# Patient Record
Sex: Female | Born: 2000 | Race: Black or African American | Hispanic: No | Marital: Single | Smoking: Former smoker
Health system: Southern US, Community
[De-identification: ages and names within clinical notes are randomized; demographics above are authoritative.]

## PROBLEM LIST (undated history)

## (undated) DIAGNOSIS — O24419 Gestational diabetes mellitus in pregnancy, unspecified control: Secondary | ICD-10-CM

## (undated) DIAGNOSIS — I10 Essential (primary) hypertension: Secondary | ICD-10-CM

## (undated) DIAGNOSIS — J45909 Unspecified asthma, uncomplicated: Secondary | ICD-10-CM

## (undated) HISTORY — PX: ABDOMINAL HERNIA REPAIR: SHX539

## (undated) HISTORY — DX: Essential (primary) hypertension: I10

## (undated) HISTORY — DX: Gestational diabetes mellitus in pregnancy, unspecified control: O24.419

---

## 2020-05-10 ENCOUNTER — Emergency Department (HOSPITAL_COMMUNITY): Payer: PRIVATE HEALTH INSURANCE | Admitting: Registered Nurse

## 2020-05-10 ENCOUNTER — Encounter (HOSPITAL_COMMUNITY): Payer: Self-pay | Admitting: *Deleted

## 2020-05-10 ENCOUNTER — Emergency Department (HOSPITAL_COMMUNITY): Payer: PRIVATE HEALTH INSURANCE

## 2020-05-10 ENCOUNTER — Encounter (HOSPITAL_COMMUNITY)
Admission: EM | Disposition: A | Discharge status: Home / Self Care | Payer: Self-pay | Source: Home / Self Care | Attending: Emergency Medicine

## 2020-05-10 ENCOUNTER — Ambulatory Visit (HOSPITAL_COMMUNITY)
Admission: EM | Admit: 2020-05-10 | Discharge: 2020-05-10 | Disposition: A | Discharge status: Home / Self Care | Payer: PRIVATE HEALTH INSURANCE | Attending: Emergency Medicine | Admitting: Emergency Medicine

## 2020-05-10 ENCOUNTER — Other Ambulatory Visit: Payer: Self-pay | Admitting: Obstetrics and Gynecology

## 2020-05-10 ENCOUNTER — Other Ambulatory Visit: Payer: Self-pay

## 2020-05-10 DIAGNOSIS — N9489 Other specified conditions associated with female genital organs and menstrual cycle: Secondary | ICD-10-CM | POA: Diagnosis not present

## 2020-05-10 DIAGNOSIS — Z20822 Contact with and (suspected) exposure to covid-19: Secondary | ICD-10-CM | POA: Insufficient documentation

## 2020-05-10 DIAGNOSIS — R52 Pain, unspecified: Secondary | ICD-10-CM

## 2020-05-10 DIAGNOSIS — Z9889 Other specified postprocedural states: Secondary | ICD-10-CM

## 2020-05-10 DIAGNOSIS — O009 Unspecified ectopic pregnancy without intrauterine pregnancy: Secondary | ICD-10-CM | POA: Diagnosis present

## 2020-05-10 DIAGNOSIS — K661 Hemoperitoneum: Secondary | ICD-10-CM

## 2020-05-10 DIAGNOSIS — Z3A00 Weeks of gestation of pregnancy not specified: Secondary | ICD-10-CM | POA: Diagnosis not present

## 2020-05-10 DIAGNOSIS — O00102 Left tubal pregnancy without intrauterine pregnancy: Secondary | ICD-10-CM

## 2020-05-10 DIAGNOSIS — O3680X0 Pregnancy with inconclusive fetal viability, not applicable or unspecified: Secondary | ICD-10-CM

## 2020-05-10 HISTORY — DX: Unspecified asthma, uncomplicated: J45.909

## 2020-05-10 HISTORY — PX: LAPAROSCOPIC UNILATERAL SALPINGECTOMY: SHX5934

## 2020-05-10 HISTORY — PX: DIAGNOSTIC LAPAROSCOPY WITH REMOVAL OF ECTOPIC PREGNANCY: SHX6449

## 2020-05-10 LAB — COMPREHENSIVE METABOLIC PANEL
ALT: 16 U/L (ref 0–44)
AST: 18 U/L (ref 15–41)
Albumin: 3.9 g/dL (ref 3.5–5.0)
Alkaline Phosphatase: 41 U/L (ref 38–126)
Anion gap: 8 (ref 5–15)
BUN: 11 mg/dL (ref 6–20)
CO2: 21 mmol/L — ABNORMAL LOW (ref 22–32)
Calcium: 8.6 mg/dL — ABNORMAL LOW (ref 8.9–10.3)
Chloride: 110 mmol/L (ref 98–111)
Creatinine, Ser: 0.84 mg/dL (ref 0.44–1.00)
GFR, Estimated: >60 mL/min (ref >60)
Glucose, Bld: 162 mg/dL — ABNORMAL HIGH (ref 70–99)
Potassium: 2.7 mmol/L — CL (ref 3.5–5.1)
Sodium: 139 mmol/L (ref 135–145)
Total Bilirubin: 0.1 mg/dL — ABNORMAL LOW (ref 0.3–1.2)
Total Protein: 6.7 g/dL (ref 6.5–8.1)

## 2020-05-10 LAB — CBC WITH DIFFERENTIAL/PLATELET
Abs Immature Granulocytes: 0.02 10*3/uL (ref 0.00–0.07)
Basophils Absolute: 0 10*3/uL (ref 0.0–0.1)
Basophils Relative: 1 %
Eosinophils Absolute: 0.3 10*3/uL (ref 0.0–0.5)
Eosinophils Relative: 4 %
HCT: 38.9 % (ref 36.0–46.0)
Hemoglobin: 12.5 g/dL (ref 12.0–15.0)
Immature Granulocytes: 0 %
Lymphocytes Relative: 54 %
Lymphs Abs: 3.6 10*3/uL (ref 0.7–4.0)
MCH: 28.9 pg (ref 26.0–34.0)
MCHC: 32.1 g/dL (ref 30.0–36.0)
MCV: 90 fL (ref 80.0–100.0)
Monocytes Absolute: 0.6 10*3/uL (ref 0.1–1.0)
Monocytes Relative: 9 %
Neutro Abs: 2.1 10*3/uL (ref 1.7–7.7)
Neutrophils Relative %: 32 %
Platelets: 313 10*3/uL (ref 150–400)
RBC: 4.32 MIL/uL (ref 3.87–5.11)
RDW: 13.2 % (ref 11.5–15.5)
WBC: 6.5 10*3/uL (ref 4.0–10.5)
nRBC: 0 % (ref 0.0–0.2)

## 2020-05-10 LAB — CBC
HCT: 33.9 % — ABNORMAL LOW (ref 36.0–46.0)
Hemoglobin: 11.4 g/dL — ABNORMAL LOW (ref 12.0–15.0)
MCH: 30.1 pg (ref 26.0–34.0)
MCHC: 33.6 g/dL (ref 30.0–36.0)
MCV: 89.4 fL (ref 80.0–100.0)
Platelets: 175 10*3/uL (ref 150–400)
RBC: 3.79 MIL/uL — ABNORMAL LOW (ref 3.87–5.11)
RDW: 13.2 % (ref 11.5–15.5)
WBC: 20.4 10*3/uL — ABNORMAL HIGH (ref 4.0–10.5)
nRBC: 0 % (ref 0.0–0.2)

## 2020-05-10 LAB — TYPE AND SCREEN
ABO/RH(D): A POS
Antibody Screen: NEGATIVE

## 2020-05-10 LAB — RESP PANEL BY RT-PCR (FLU A&B, COVID) ARPGX2
Influenza A by PCR: NEGATIVE
Influenza B by PCR: NEGATIVE
SARS Coronavirus 2 by RT PCR: NEGATIVE

## 2020-05-10 LAB — I-STAT BETA HCG BLOOD, ED (MC, WL, AP ONLY): I-stat hCG, quantitative: >2000 m[IU]/mL — ABNORMAL HIGH (ref <5)

## 2020-05-10 LAB — HCG, QUANTITATIVE, PREGNANCY: hCG, Beta Chain, Quant, S: 3495 m[IU]/mL — ABNORMAL HIGH (ref <5)

## 2020-05-10 LAB — WET PREP, GENITAL
Sperm: NONE SEEN
Trich, Wet Prep: NONE SEEN
Yeast Wet Prep HPF POC: NONE SEEN

## 2020-05-10 LAB — ABO/RH: ABO/RH(D): A POS

## 2020-05-10 SURGERY — LAPAROSCOPY, WITH ECTOPIC PREGNANCY SURGICAL TREATMENT
Anesthesia: General

## 2020-05-10 MED ORDER — PROPOFOL 10 MG/ML IV BOLUS
INTRAVENOUS | Status: AC
  Filled 2020-05-10: qty 20

## 2020-05-10 MED ORDER — MIDAZOLAM HCL 2 MG/2ML IJ SOLN
INTRAMUSCULAR | Status: DC | PRN
  Administered 2020-05-10: 2 mg via INTRAVENOUS

## 2020-05-10 MED ORDER — BUPIVACAINE HCL 0.5 % IJ SOLN
INTRAMUSCULAR | Status: DC | PRN
  Administered 2020-05-10: 20 mL

## 2020-05-10 MED ORDER — FENTANYL CITRATE (PF) 250 MCG/5ML IJ SOLN
INTRAMUSCULAR | Status: DC | PRN
  Administered 2020-05-10: 50 ug via INTRAVENOUS
  Administered 2020-05-10: 100 ug via INTRAVENOUS
  Administered 2020-05-10 (×2): 50 ug via INTRAVENOUS

## 2020-05-10 MED ORDER — SODIUM CHLORIDE 0.9% IV SOLUTION: Freq: Once | INTRAVENOUS | Status: DC

## 2020-05-10 MED ORDER — POTASSIUM CHLORIDE 10 MEQ/100ML IV SOLN
10.0000 meq | Freq: Once | INTRAVENOUS | Status: AC
  Administered 2020-05-10: 10 meq via INTRAVENOUS
  Filled 2020-05-10: qty 100

## 2020-05-10 MED ORDER — ONDANSETRON HCL 4 MG/2ML IJ SOLN
4.0000 mg | Freq: Once | INTRAMUSCULAR | Status: AC
  Administered 2020-05-10: 4 mg via INTRAVENOUS
  Filled 2020-05-10: qty 2

## 2020-05-10 MED ORDER — MORPHINE SULFATE (PF) 4 MG/ML IV SOLN
4.0000 mg | Freq: Once | INTRAVENOUS | Status: AC
  Administered 2020-05-10: 4 mg via INTRAVENOUS
  Filled 2020-05-10: qty 1

## 2020-05-10 MED ORDER — ROCURONIUM BROMIDE 10 MG/ML (PF) SYRINGE
PREFILLED_SYRINGE | INTRAVENOUS | Status: DC | PRN
  Administered 2020-05-10: 10 mg via INTRAVENOUS
  Administered 2020-05-10: 50 mg via INTRAVENOUS

## 2020-05-10 MED ORDER — DEXAMETHASONE SODIUM PHOSPHATE 10 MG/ML IJ SOLN
INTRAMUSCULAR | Status: AC
  Filled 2020-05-10: qty 1

## 2020-05-10 MED ORDER — SODIUM CHLORIDE 0.9 % IV SOLN: INTRAVENOUS | Status: DC

## 2020-05-10 MED ORDER — AMISULPRIDE (ANTIEMETIC) 5 MG/2ML IV SOLN
INTRAVENOUS | Status: AC
  Filled 2020-05-10: qty 4

## 2020-05-10 MED ORDER — PHENYLEPHRINE 40 MCG/ML (10ML) SYRINGE FOR IV PUSH (FOR BLOOD PRESSURE SUPPORT)
PREFILLED_SYRINGE | INTRAVENOUS | Status: DC | PRN
  Administered 2020-05-10 (×2): 80 ug via INTRAVENOUS
  Administered 2020-05-10: 120 ug via INTRAVENOUS

## 2020-05-10 MED ORDER — ACETAMINOPHEN 10 MG/ML IV SOLN: 1000.0000 mg | Freq: Once | INTRAVENOUS | Status: DC | PRN

## 2020-05-10 MED ORDER — OXYCODONE HCL 5 MG PO TABS: 5.0000 mg | ORAL_TABLET | Freq: Once | ORAL | Status: DC | PRN

## 2020-05-10 MED ORDER — PHENYLEPHRINE 40 MCG/ML (10ML) SYRINGE FOR IV PUSH (FOR BLOOD PRESSURE SUPPORT)
PREFILLED_SYRINGE | INTRAVENOUS | Status: AC
  Filled 2020-05-10: qty 10

## 2020-05-10 MED ORDER — HYDROMORPHONE HCL 1 MG/ML IJ SOLN
INTRAMUSCULAR | Status: AC
  Filled 2020-05-10: qty 0.5

## 2020-05-10 MED ORDER — BUPIVACAINE HCL (PF) 0.5 % IJ SOLN
INTRAMUSCULAR | Status: AC
  Filled 2020-05-10: qty 30

## 2020-05-10 MED ORDER — ACETAMINOPHEN 325 MG PO TABS
325.0000 mg | ORAL_TABLET | ORAL | Status: DC | PRN
Start: 2020-05-10 — End: 2020-05-10

## 2020-05-10 MED ORDER — FENTANYL CITRATE (PF) 250 MCG/5ML IJ SOLN
INTRAMUSCULAR | Status: AC
  Filled 2020-05-10: qty 5

## 2020-05-10 MED ORDER — LIDOCAINE 2% (20 MG/ML) 5 ML SYRINGE
INTRAMUSCULAR | Status: DC | PRN
  Administered 2020-05-10: 40 mg via INTRAVENOUS

## 2020-05-10 MED ORDER — SUCCINYLCHOLINE CHLORIDE 200 MG/10ML IV SOSY
PREFILLED_SYRINGE | INTRAVENOUS | Status: DC | PRN
  Administered 2020-05-10: 120 mg via INTRAVENOUS

## 2020-05-10 MED ORDER — CHLORHEXIDINE GLUCONATE 0.12 % MT SOLN: 15.0000 mL | Freq: Once | OROMUCOSAL | Status: AC

## 2020-05-10 MED ORDER — PROPOFOL 10 MG/ML IV BOLUS
INTRAVENOUS | Status: DC | PRN
  Administered 2020-05-10: 140 mg via INTRAVENOUS

## 2020-05-10 MED ORDER — OXYCODONE-ACETAMINOPHEN 5-325 MG PO TABS: 1.0000 | ORAL_TABLET | Freq: Four times a day (QID) | ORAL | 0 refills | Status: DC | PRN

## 2020-05-10 MED ORDER — AMISULPRIDE (ANTIEMETIC) 5 MG/2ML IV SOLN
10.0000 mg | Freq: Once | INTRAVENOUS | Status: AC | PRN
Start: 2020-05-10 — End: 2020-05-10
  Administered 2020-05-10: 10 mg via INTRAVENOUS

## 2020-05-10 MED ORDER — SODIUM CHLORIDE 0.9 % IV SOLN: 10.0000 mL/h | Freq: Once | INTRAVENOUS | Status: DC

## 2020-05-10 MED ORDER — LACTATED RINGERS IV SOLN: INTRAVENOUS | Status: DC

## 2020-05-10 MED ORDER — FERROUS SULFATE 325 (65 FE) MG PO TABS: 325.0000 mg | ORAL_TABLET | ORAL | 1 refills | Status: DC

## 2020-05-10 MED ORDER — SODIUM CHLORIDE 0.9 % IR SOLN
Status: DC | PRN
  Administered 2020-05-10: 3000 mL

## 2020-05-10 MED ORDER — FENTANYL CITRATE (PF) 100 MCG/2ML IJ SOLN: 25.0000 ug | INTRAMUSCULAR | Status: DC | PRN

## 2020-05-10 MED ORDER — LIDOCAINE 2% (20 MG/ML) 5 ML SYRINGE
INTRAMUSCULAR | Status: AC
  Filled 2020-05-10: qty 5

## 2020-05-10 MED ORDER — DOCUSATE SODIUM 100 MG PO CAPS: 100.0000 mg | ORAL_CAPSULE | Freq: Two times a day (BID) | ORAL | 1 refills | Status: AC | PRN

## 2020-05-10 MED ORDER — ACETAMINOPHEN 160 MG/5ML PO SOLN: 325.0000 mg | ORAL | Status: DC | PRN

## 2020-05-10 MED ORDER — SUGAMMADEX SODIUM 200 MG/2ML IV SOLN
INTRAVENOUS | Status: DC | PRN
  Administered 2020-05-10: 400 mg via INTRAVENOUS

## 2020-05-10 MED ORDER — MORPHINE SULFATE (PF) 2 MG/ML IV SOLN
2.0000 mg | Freq: Once | INTRAVENOUS | Status: AC
Start: 2020-05-10 — End: 2020-05-10
  Administered 2020-05-10: 2 mg via INTRAVENOUS
  Filled 2020-05-10: qty 1

## 2020-05-10 MED ORDER — SUCCINYLCHOLINE CHLORIDE 200 MG/10ML IV SOSY
PREFILLED_SYRINGE | INTRAVENOUS | Status: AC
  Filled 2020-05-10: qty 10

## 2020-05-10 MED ORDER — ONDANSETRON HCL 4 MG/2ML IJ SOLN
INTRAMUSCULAR | Status: DC | PRN
  Administered 2020-05-10: 4 mg via INTRAVENOUS

## 2020-05-10 MED ORDER — CHLORHEXIDINE GLUCONATE 0.12 % MT SOLN
OROMUCOSAL | Status: AC
  Administered 2020-05-10: 15 mL via OROMUCOSAL
  Filled 2020-05-10: qty 15

## 2020-05-10 MED ORDER — ONDANSETRON HCL 4 MG/2ML IJ SOLN
INTRAMUSCULAR | Status: AC
  Filled 2020-05-10: qty 2

## 2020-05-10 MED ORDER — MIDAZOLAM HCL 2 MG/2ML IJ SOLN
INTRAMUSCULAR | Status: AC
  Filled 2020-05-10: qty 2

## 2020-05-10 MED ORDER — DEXAMETHASONE SODIUM PHOSPHATE 10 MG/ML IJ SOLN
INTRAMUSCULAR | Status: DC | PRN
  Administered 2020-05-10: 10 mg via INTRAVENOUS

## 2020-05-10 MED ORDER — HYDROMORPHONE HCL 1 MG/ML IJ SOLN
INTRAMUSCULAR | Status: DC | PRN
  Administered 2020-05-10: .5 mg via INTRAVENOUS

## 2020-05-10 MED ORDER — ALBUMIN HUMAN 5 % IV SOLN: INTRAVENOUS | Status: DC | PRN

## 2020-05-10 MED ORDER — OXYCODONE HCL 5 MG/5ML PO SOLN: 5.0000 mg | Freq: Once | ORAL | Status: DC | PRN

## 2020-05-10 MED ORDER — ORAL CARE MOUTH RINSE
15.0000 mL | Freq: Once | OROMUCOSAL | Status: AC
Start: 2020-05-10 — End: 2020-05-10

## 2020-05-10 MED ORDER — SCOPOLAMINE 1 MG/3DAYS TD PT72: 1.0000 | MEDICATED_PATCH | TRANSDERMAL | Status: DC

## 2020-05-10 MED ORDER — PROMETHAZINE HCL 25 MG/ML IJ SOLN: 6.2500 mg | INTRAMUSCULAR | Status: DC | PRN

## 2020-05-10 MED ORDER — ROCURONIUM BROMIDE 10 MG/ML (PF) SYRINGE
PREFILLED_SYRINGE | INTRAVENOUS | Status: AC
  Filled 2020-05-10: qty 10

## 2020-05-10 MED ORDER — SODIUM CHLORIDE 0.9 % IV BOLUS
1000.0000 mL | Freq: Once | INTRAVENOUS | Status: AC
  Administered 2020-05-10: 1000 mL via INTRAVENOUS

## 2020-05-10 SURGICAL SUPPLY — 42 items
APPLICATOR COTTON TIP 6 STRL (MISCELLANEOUS) ×2 IMPLANT
APPLICATOR COTTON TIP 6IN STRL (MISCELLANEOUS) ×3
BLADE SURG 15 STRL LF DISP TIS (BLADE) ×2 IMPLANT
BLADE SURG 15 STRL SS (BLADE) ×1
CABLE HIGH FREQUENCY MONO STRZ (ELECTRODE) IMPLANT
DEFOGGER SCOPE WARMER CLEARIFY (MISCELLANEOUS) ×3 IMPLANT
DERMABOND ADVANCED (GAUZE/BANDAGES/DRESSINGS) ×1
DERMABOND ADVANCED .7 DNX12 (GAUZE/BANDAGES/DRESSINGS) ×2 IMPLANT
DRSG OPSITE POSTOP 3X4 (GAUZE/BANDAGES/DRESSINGS) ×3 IMPLANT
DURAPREP 26ML APPLICATOR (WOUND CARE) ×3 IMPLANT
ELECT REM PT RETURN 9FT ADLT (ELECTROSURGICAL) ×3
ELECTRODE REM PT RTRN 9FT ADLT (ELECTROSURGICAL) ×2 IMPLANT
GLOVE BIOGEL PI IND STRL 7.5 (GLOVE) ×4 IMPLANT
GLOVE BIOGEL PI INDICATOR 7.5 (GLOVE) ×2
GLOVE SURG SS PI 7.0 STRL IVOR (GLOVE) ×3 IMPLANT
GLOVE SURG UNDER POLY LF SZ7 (GLOVE) ×6 IMPLANT
GOWN STRL REUS W/ TWL LRG LVL3 (GOWN DISPOSABLE) ×6 IMPLANT
GOWN STRL REUS W/TWL LRG LVL3 (GOWN DISPOSABLE) ×3
KIT TURNOVER KIT B (KITS) ×3 IMPLANT
LIGASURE VESSEL 5MM BLUNT TIP (ELECTROSURGICAL) ×3 IMPLANT
NS IRRIG 1000ML POUR BTL (IV SOLUTION) ×3 IMPLANT
PACK LAPAROSCOPY BASIN (CUSTOM PROCEDURE TRAY) ×3 IMPLANT
PACK TRENDGUARD 450 HYBRID PRO (MISCELLANEOUS) ×2 IMPLANT
PAD OB MATERNITY 4.3X12.25 (PERSONAL CARE ITEMS) ×3 IMPLANT
POUCH LAPAROSCOPIC INSTRUMENT (MISCELLANEOUS) ×3 IMPLANT
POUCH SPECIMEN RETRIEVAL 10MM (ENDOMECHANICALS) ×3 IMPLANT
PROTECTOR NERVE ULNAR (MISCELLANEOUS) ×6 IMPLANT
SCISSORS LAP 5X35 DISP (ENDOMECHANICALS) IMPLANT
SET IRRIG TUBING LAPAROSCOPIC (IRRIGATION / IRRIGATOR) ×3 IMPLANT
SET TUBE SMOKE EVAC HIGH FLOW (TUBING) ×3 IMPLANT
SLEEVE ADV FIXATION 5X100MM (TROCAR) ×3 IMPLANT
SUT MON AB 4-0 PS1 27 (SUTURE) ×3 IMPLANT
SUT VICRYL 0 UR6 27IN ABS (SUTURE) ×3 IMPLANT
SYR 10ML LL (SYRINGE) ×3 IMPLANT
SYSTEM CARTER THOMASON II (TROCAR) ×3 IMPLANT
TOWEL GREEN STERILE FF (TOWEL DISPOSABLE) ×6 IMPLANT
TRAY FOLEY W/BAG SLVR 14FR (SET/KITS/TRAYS/PACK) ×3 IMPLANT
TRENDGUARD 450 HYBRID PRO PACK (MISCELLANEOUS) ×3
TROCAR ADV FIXATION 5X100MM (TROCAR) ×3 IMPLANT
TROCAR BALLN 12MMX100 BLUNT (TROCAR) ×3 IMPLANT
TROCAR XCEL NON-BLD 11X100MML (ENDOMECHANICALS) ×3 IMPLANT
WARMER LAPAROSCOPE (MISCELLANEOUS) ×3 IMPLANT

## 2020-05-10 NOTE — ED Triage Notes (Signed)
Pt came in with c/o lower abdominal pain. Pt states it is in her suprapubic region. Pt has hx of hernia and hernia repair when she was [age] years old. Pt states it started after a BM

## 2020-05-10 NOTE — ED Notes (Signed)
This nurse spoke with Rolene Course, RN at [facility] Stay and report given. Dr. Vergie Living is the accepting physician, per Rolene Course, RN.

## 2020-05-10 NOTE — ED Notes (Addendum)
Pt with stable VS ambulated to the bathroom independently. This nurse heard the pt call out from the bathroom. This nurse entered the bathroom and the pt was standing upright and stated she felt like she was going to pass out. This nurse sat the pt back onto the bathroom toilet and with the assistance of a second ED RN and ED tech, pt was assisted into a wheelchair and taken back to there ED exam room. Pt was able to move from wheelchair to stretcher with stand-by assist. Pt's VS obtained and stable. 2nd liter NS bolus hung. EKG obtained and provided to Dr. Freida Busman. Dr. Freida Busman notified of above information. No additional orders at this time. Will continue to monitor. US exam was completed following pt's return from the bathroom. Pt unable to provide urine sample at this time.

## 2020-05-10 NOTE — Transfer of Care (Signed)
Immediate Anesthesia Transfer of Care Note  Patient: Alison Morales  Procedure(s) Performed: DIAGNOSTIC LAPAROSCOPY (N/A ) LAPAROSCOPIC LEFT SALPINGECTOMY WITH REMOVAL OF ECTOPIC PREGNANCY (Left )  Patient Location: PACU  Anesthesia Type:General  Level of Consciousness: awake, alert  and oriented  Airway & Oxygen Therapy: Patient Spontanous Breathing  Post-op Assessment: Report given to RN and Post -op Vital signs reviewed and stable  Post vital signs: Reviewed and stable  Last Vitals:  Vitals Value Taken Time  BP 152/77 05/10/20 1312  Temp 36.4 C 05/10/20 1312  Pulse 97 05/10/20 1317  Resp 18 05/10/20 1317  SpO2 99 % 05/10/20 1317  Vitals shown include unvalidated device data.  Last Pain:  Vitals:   05/10/20 1312  TempSrc:   PainSc: 0-No pain         Complications: No complications documented.

## 2020-05-10 NOTE — Anesthesia Procedure Notes (Signed)

## 2020-05-10 NOTE — Brief Op Note (Signed)
05/10/2020  1:24 PM  PATIENT:  Alison Morales  20 y.o. female  PRE-OPERATIVE DIAGNOSIS:  Ruptured Ectopic Pregnancy  POST-OPERATIVE DIAGNOSIS:  Ruptured Ectopic Pregnancy  PROCEDURE:  Laparoscopic left salpingectomy SURGEON:  Surgeon(s) and Role:    Marks Bing, MD - Primary  PHYSICIAN ASSISTANT:   ASSISTANTS: none   ANESTHESIA:   general  EBL:  1500 mL of hemoperitoneum; minimal EBL for the case  BLOOD ADMINISTERED:2U PRBC to be given in PACU  IVF: albumin and crystalloid in the OR  DRAINS: indwelling foley UOP clear   LOCAL MEDICATIONS USED:  MARCAINE     SPECIMEN:  Left tube with ectopic  DISPOSITION OF SPECIMEN:  PATHOLOGY  COUNTS:  YES  TOURNIQUET:  * No tourniquets in log *  DICTATION: .Note written in EPIC  PLAN OF CARE: Discharge to home after PACU. Plan to recheck CBC in PACU after PRBCs and if appropriate to d/c to home.   PATIENT DISPOSITION:  PACU - hemodynamically stable.   Delay start of Pharmacological VTE agent (>24hrs) due to surgical blood loss or risk of bleeding: not applicable  Cornelia Copa MD Attending Center for [facility] (Faculty Practice) GYN Consult Phone: [phone] (M-F, 0800-1700) & [phone] (Off hours, weekends, holidays)

## 2020-05-10 NOTE — Anesthesia Preprocedure Evaluation (Signed)
Anesthesia Evaluation  Patient identified by MRN, date of birth, ID band Patient awake    Reviewed: Allergy & Precautions, NPO status , Patient's Chart, lab work & pertinent test results  Airway Mallampati: I  TM Distance: >3 FB Neck ROM: Full    Dental  (+) Teeth Intact, Dental Advisory Given   Pulmonary neg pulmonary ROS,    breath sounds clear to auscultation       Cardiovascular negative cardio ROS   Rhythm:Regular Rate:Normal     Neuro/Psych negative neurological ROS     GI/Hepatic Neg liver ROS,   Endo/Other    Renal/GU      Musculoskeletal   Abdominal Normal abdominal exam  (+)   Peds  Hematology negative hematology ROS (+)   Anesthesia Other Findings   Reproductive/Obstetrics                             Anesthesia Physical Anesthesia Plan  ASA: I and emergent  Anesthesia Plan: General   Post-op Pain Management:    Induction: Rapid sequence and Cricoid pressure planned  PONV Risk Score and Plan: 4 or greater and Ondansetron, Dexamethasone, Midazolam and Scopolamine patch - Pre-op  Airway Management Planned: Oral ETT  Additional Equipment: None  Intra-op Plan:   Post-operative Plan: Extubation in OR  Informed Consent: I have reviewed the patients History and Physical, chart, labs and discussed the procedure including the risks, benefits and alternatives for the proposed anesthesia with the patient or authorized representative who has indicated his/her understanding and acceptance.     Dental advisory given  Plan Discussed with: CRNA  Anesthesia Plan Comments:         Anesthesia Quick Evaluation

## 2020-05-10 NOTE — ED Notes (Signed)
Told patient we need a urine sample. Patient unable to go at this moment.

## 2020-05-10 NOTE — Progress Notes (Signed)
Patient lost 1500 mL of blood in the OR. MD ordered two units of blood to be given in PACU. [facility] CRNA started the first transfusion and second transfusion was given by PACU RN. Patient remained stable during both administrations. A post transfusion CBC was done and we are waiting for the results. [place] MD and Hart Rochester MD will both be notified of the CBC results. Patient has been placed on PACU hold. Will continue to monitor the patient.

## 2020-05-10 NOTE — ED Notes (Signed)
Report received from Felipa Furnace, RN.

## 2020-05-10 NOTE — Op Note (Signed)
Operative Note   05/10/2020  PRE-OP DIAGNOSIS *Left ectopic pregnancy   POST-OP DIAGNOSIS *Ruptured left ectopic pregnancy *Hemoperitoneum   SURGEON: Surgeon(s) and Role:    * Mangham Bing, MD - Primary  ASSISTANT: None  PROCEDURE: Laparoscopic left salpingectomy  ANESTHESIA: General and local  ESTIMATED BLOOD LOSS: of hemoperitoneum. EBL minimal for the case  DRAINS: indwelling foley UOP   TOTAL IV FLUIDS: of albumin. crystalloid  VTE PROPHYLAXIS: SCDs to the bilateral lower extremities  ANTIBIOTICS: not indicated  SPECIMENS: left fallopian tube  DISPOSITION: PACU - hemodynamically stable.  CONDITION: stable  COMPLICATIONS: None  FINDINGS: EGBUS, vaginal vault, cervix normal with small, mobile uterus palpated. Patient sounded to 8cm.   On diagnostic laparoscopy, large volume hemoperitnoeum. No intra-abdominal adhesions. Grossly normal uterus, right tube and bilateral ovaries.  Left tube, blue, engorged and slowly bleeding  DESCRIPTION OF PROCEDURE: After informed consent was obtained, the patient was taken to the operating room where anesthesia was obtained without difficulty. The patient was positioned in the dorsal lithotomy position in Liscomb stirrups and her arms were carefully tucked at her sides and the usual precautions were taken.  She was prepped and draped in normal sterile fashion.  Time-out was performed and a Foley catheter was placed into the bladder. A Hulka uterine manipulator was then placed in the uterus without incident. Gloves were then changed.  Anesthesia confirmed that there was a gastric tube in place and local anesthesia was then injected a [place].  A 13mm port was then placed with the laparoscope attached for visualization. Pressure was and abdomen then insufflated to . Laparoscope introduced and patient was then placed into Trendelenburg. A 93mm port was then placed in the LLQ and a 36mm port placed in  the suprapubic area under direct visualization and after injection of local anesthesia.  As much hemoperitoneum as possible was removed and the left tube removed by using the Ligasure device to serially cauterize and transect the mesosalpinx; a small stump was left at the cornua. The tube was then removed with the endocatch bag.  The patient was placed in reverse trendelenburg and then back into trendelenburg to try and remove as much hemoperitoneum as possible.  Gas was then dropped to and the operative site remained hemostatic.  Shellia Carwin II device used to close the suprapubic port with 0-vicryl. The LLQ port was removed under direct visualization and then the RUQ port was removed after the gas was released.  The fascia was then tied and the skin there and at the RUQ port closed with 4-0 monocryl and the LLQ skin closed with dermabond.  Hulka removed and cervix was hemostatic.   Sponge, lap and needle counts were correct x2.  The patient was taken to recovery room in excellent condition.  Cornelia Copa MD Attending Center for [employer] [facility])

## 2020-05-10 NOTE — H&P (Addendum)
Obstetrics & Gynecology H&P   Date of Admission: 05/10/2020   Requesting Provider: [facility]  Primary OBGYN: None Primary Care Provider: Pcp, No  Reason for Admission: concern for ectopic pregnancy, 10cm left adnexal mass  History of Present Illness: Alison Morales is a 20 y.o. G2P0010 (Patient's last menstrual period was 04/13/2020.), with the above CC. PMHx is significant for h/o hernia repair.  See ED notes but patient with abdominal pain starting today. ED called me and stated had istat hcg >2k and u/s concerning for ruptured ectopic with normal and stable VS. OR here and [facility] available so patient transferred here   Patient states with irregular periods (can be every 1-42m) and not on anything for birth control or to help regulate her periods. She did not know she was pregnant prior to presenting to the ED.   Last PO before midnight    ROS: A 12-point review of systems was performed and negative, except as stated in the above HPI.  OBGYN History: As per HPI.   Past Medical History: Past Medical History:  Diagnosis Date  . Asthma     Past Surgical History: As per HPI  Family History:  History reviewed. No pertinent family history.  Social History:  Social History   Socioeconomic History  . Marital status: Single    Spouse name: Not on file  . Number of children: Not on file  . Years of education: Not on file  . Highest education level: Not on file  Occupational History  . Not on file  Tobacco Use  . Smoking status: Never Smoker  . Smokeless tobacco: Never Used  Substance and Sexual Activity  . Alcohol use: Never  . Drug use: Never  . Sexual activity: Yes  Other Topics Concern  . Not on file  Social History Narrative  . Not on file   Social Determinants of Health   Financial Resource Strain: Not on file  Food Insecurity: Not on file  Transportation Needs: Not on file  Physical Activity: Not on file  Stress: Not on file  Social Connections: Not on  file  Intimate Partner Violence: Not on file    Allergy: No Known Allergies  Current Outpatient Medications: No medications prior to admission.     Hospital Medications: Current Facility-Administered Medications  Medication Dose Route Frequency Provider Last Rate Last Admin  . 0.9 %  sodium chloride infusion   Intravenous Continuous Edon Bing, MD      . potassium chloride 10 mEq in 100 mL IVPB  10 mEq Intravenous Once Shelton Silvas, MD      . scopolamine (TRANSDERM-SCOP) 1 MG/3DAYS 1.5 mg  1 patch Transdermal Q72H Shelton Silvas, MD         Physical Exam:   Current Vital Signs 24h Vital Sign Ranges  T 98.3 F (36.8 C) Temp  Avg: 98.5 F (36.9 C)  Min: 98.3 F (36.8 C)  Max: 98.6 F (37 C)  BP 111/73 BP  Min: 107/72  Max: 145/100  HR 86 Pulse  Avg: 67.9  Min: 60  Max: 86  RR 20 Resp  Avg: 18.9  Min: 15  Max: 22  SaO2 100 % Room Air SpO2  Avg: 99.9 %  Min: 98 %  Max: 100 %       24 Hour I/O Current Shift I/O  Time Ins Outs No intake/output data recorded. No intake/output data recorded.   Patient Vitals for the past 24 hrs:  BP Temp Temp src  Pulse Resp SpO2 Height Weight  05/10/20 0850 111/73 -- -- 86 20 100 % -- --  05/10/20 0840 122/77 -- -- 76 19 100 % -- --  05/10/20 0830 121/78 -- -- 71 20 100 % -- --  05/10/20 0821 115/74 -- -- -- 18 -- -- --  05/10/20 0810 108/80 -- -- 70 20 100 % -- --  05/10/20 0800 115/80 -- -- 65 20 100 % -- --  05/10/20 0755 114/76 -- -- 65 20 100 % -- --  05/10/20 0752 109/75 -- -- 63 20 100 % -- --  05/10/20 0746 112/74 -- -- 66 18 100 % -- --  05/10/20 0745 112/74 -- -- 62 18 100 % -- --  05/10/20 0738 108/66 -- -- 64 18 100 % -- --  05/10/20 0732 107/72 -- -- -- -- -- -- --  05/10/20 0716 122/82 98.3 F (36.8 C) Oral 69 18 100 % -- --  05/10/20 0638 129/84 -- -- 61 15 98 % -- --  05/10/20 0600 121/79 -- -- 60 17 100 % -- --  05/10/20 0537 -- -- -- -- -- -- 5\' 9"  (1.753 m) 83.9 kg  05/10/20 0536 (!) 145/100 98.6 F (37  C) Oral 72 (!) 22 100 % -- --    Body mass index is 27.32 kg/m. General appearance: Well nourished, well developed female in no acute distress.  Cardiovascular: S1, S2 normal, no murmur, rub or gallop, regular rate and rhythm Respiratory:  Clear to auscultation bilateral. Normal respiratory effort Abdomen: rare BS, diffusely and minimally ttp, no peritoneal s/s.  Neuro/Psych:  Normal mood and affect.  Skin:  Warm and dry.  Extremities: no clubbing, cyanosis, or edema.    Laboratory: Beta HCG: 3495 COVID: negative  Recent Labs  Lab 05/10/20 0543  WBC 6.5  HGB 12.5  HCT 38.9  PLT 313   Recent Labs  Lab 05/10/20 0543  NA 139  K 2.7*  CL 110  CO2 21*  BUN 11  CREATININE 0.84  CALCIUM 8.6*  PROT 6.7  BILITOT 0.1*  ALKPHOS 41  ALT 16  AST 18  GLUCOSE 162*   No results for input(s): APTT, INR, PTT in the last 168 hours.  Invalid input(s): DRHAPTT Recent Labs  Lab 05/10/20 0839  ABORH A POS    Imaging:  Narrative & Impression  CLINICAL DATA:  Pelvic pain.  Positive pregnancy test.  EXAM: OBSTETRIC <14 WK 07/10/20 AND TRANSVAGINAL OB US  TECHNIQUE: Both transabdominal and transvaginal ultrasound examinations were performed for complete evaluation of the gestation as well as the maternal uterus, adnexal regions, and pelvic cul-de-sac. Transvaginal technique was performed to assess early pregnancy.  COMPARISON:  None.  FINDINGS: Intrauterine gestational sac: None.  Yolk sac:  None.  Embryo:  None.  Cardiac Activity: N/A  Subchorionic hemorrhage:  None visualized.  Maternal uterus/adnexae: Both maternal ovaries are identified measuring 3.7 x 2.0 x 1.8 cm on the left and 2.5 x 4.2 x 2.4 on the right. A complex process in the left adnexal space is heterogeneous and ill-defined measuring up to 10 x 5 cm on transabdominal imaging. There is no substantial flow signal within this process. Moderate volume mildly complex fluid noted in the  cul-de-sac.  IMPRESSION: 1. No intrauterine gestational sac. No classic or characteristic ectopic pregnancy identified. 2. Relatively large (10 cm) complex "mass" in the left adnexal space. Sonographic imaging features would be compatible with a hematoma and ruptured ectopic with adherent clot could produce this appearance.  This finding is associated with a moderate volume of moderately complex free fluid in the pelvis although the complexity of the fluid is not as marked as typically seen in acute hemoperitoneum. Nevertheless, given the positive pregnancy test and no intrauterine gestational sac, ruptured ectopic pregnancy is a consideration.  Critical Value/emergent results were called by telephone at the time of interpretation on 05/10/2020 at 8:16 am to provider Dr. Freida Busman, who verbally acknowledged these results.   Electronically Signed   By: Kennith Center M.D.   On: 05/10/2020 08:16    Assessment: Alison Morales is a 20 y.o. with large left adnexal mass. Concern for ectopic pregnancy. Pt stable  Plan: I discussed with her concern for ectopic but quant at that level may not necessarily show an IUP but imaging her s/s and imaging, she does needed to be investigated in the OR. I told her I would try laparoscopically and may need to remove a tube, ovary, or both and she may need an ex-lap depending on what is found surgically  Type and screen for this hospital ordered and plan to proceed once labs are back  Anesthesia supplementing her K  I told her if everything goes well and able to do it laparoscopically, she can likely go home later on today but if needs ex-lap she would need to stay at least overnight.   Total time taking care of the patient was 35 minutes, with greater than 50% of the time spent in face to face interaction with the patient.  Cornelia Copa MD Attending Center for [facility])

## 2020-05-10 NOTE — ED Notes (Signed)
Pt attached to cardiac monitor x2. VSS at this time. A&O x4. [place] at the bedside.

## 2020-05-10 NOTE — ED Notes (Signed)
[facility] here to take pt at this time.

## 2020-05-10 NOTE — ED Provider Notes (Addendum)
Patient signed to me by Dr. Eudelia Bunch pending pelvic ultrasound which is positive for ruptured ectopic patient also with hypokalemia and was given a round of IV potassium..  Discussed with Dr. Vergie Living from GYN who take the patient to the OR either at [facility] or [place] [facility] depending on schedule.  Patient notified.  Repeat vital signs prior to patient being transferred remained stable with blood pressure over 120 and heart rate stable in the 60s and patient mentating appropriately   Lorre Nick, MD 05/10/20 [id number]    Lorre Nick, MD 05/10/20 6286    Lorre Nick, MD 05/10/20 [id number]

## 2020-05-10 NOTE — Discharge Instructions (Addendum)
Laparoscopic Surgery Discharge Instructions ° ° °You have just undergone a  laparoscopic surgery.  The following list should answer your most common questions.  Although we will discuss your surgery and post-operative instructions with you prior to your discharge, this list will serve as a reminder if you fail to recall the details of what we discussed. ° °We will discuss your surgery once again in detail at your post-op visit in two to four weeks. If you haven’t already done so, please call to make your appointment as soon as possible. ° °How you will feel: Although you have just undergone a major surgery, your recovery will be significantly shorter since the surgery was performed through much smaller incisions than the traditional approach.  You should feel slightly better each day.  If you suddenly feel much worse than the prior day, please call the clinic.  It’s important during the early part of your recovery that you maintain some activity.  Walking is encouraged.  You will quicken your recovery by continued activity. ° °Incision:  Your incisions will be closed with dissolvable stitches or surgical adhesive (glue).  There may be Band-aids and/or Steri-strips covering your incisions.  If there is no drainage from the incisions you may remove the Band-aids in one to two days.  You may notice some minor bruising at the incision sites.  This is common and will resolve within several days.  Please inform us if the redness at the edges of your incision appears to be spreading.  If the skin around your incision becomes warm to the touch, or if you notice a pus-like drainage, please call the office. ° °Stairs/Driving/Activities: You may climb stairs if necessary.  If you’ve had general anesthesia, do not drive a car the rest of the day today.  You may begin light housework when you feel up to it, but avoid heavy lifting (more than 15-20lbs) or pushing until cleared for these activities by your physician. ° °Hygiene:   Do not soak your incisions.  Showers are acceptable but you may not take a bath or swim in a pool.  Cleanse your incisions daily with soap and water. ° °Medications:  Please resume taking any medications that you were taking prior to the surgery.  If we have prescribed any new medications for you, please take them as directed. ° °Constipation:  It is fairly common to experience some difficulty in moving your bowels following major surgery.  Being active will help to reduce this likelihood. A diet rich in fiber and plenty of liquids is desirable.  If you do become constipated, a mild laxative such as Miralax, Milk of Magnesia, or Metamucil, or a stool softener such as Colace, is recommended. ° °General Instructions: If you develop a fever of 100.5 degrees or higher, please call the office number(s) below for physician on call. ° ° ° °

## 2020-05-10 NOTE — ED Notes (Signed)
Pt departed the dept via [facility] at this time.

## 2020-05-10 NOTE — ED Notes (Addendum)
Confirmed ectopic pregnancy via [place] per ED provider, Dr. Freida Busman. Per Dr. Freida Busman, pt to go emergently to [facility]U at [facility].

## 2020-05-10 NOTE — Progress Notes (Signed)
Patient's incision on the lower left side has scant bleeding, a gauze and tape dressing was applied. [place] MD was notified and they are advised to monitor it while at home if the bleeding persist or worsens they were instructed to call MD office. Patient was instructed how to apply a gauze dressing to the incision. Patient was sent home with 4x4 gauze, tape, and peri pads. D/C instrustions were done with the patient and spouse [name], and a packet was sent with them. Patient's lab work was stable and reported off to both [place] MD and Vergie Living MD. Patient was cleared to go home and left in stable condition.

## 2020-05-10 NOTE — ED Provider Notes (Signed)
[facility]-EMERGENCY DEPT Provider Note  CSN: [id number] Arrival date & time: 05/10/20 7412  Chief Complaint(s) Abdominal Pain  HPI Alison Morales is a 20 y.o. female who presents to the emergency department with sudden onset suprapubic abdominal aching.  Severe nature.  Constant since onset.  Worse with palpation, ambulation and certain positions.  No alleviating factors.  She is endorsing clamminess and nausea.  Incident occurred while trying to have a bowel movement.  Denies any bloody bowel movements.  No emesis.  No recent fevers or infections.  She denies any vaginal bleeding or discharge.  Last menstrual cycle was 1 month ago.  Admits to being sexually active, with female partner.  Unprotected sex.  HPI  Past Medical History No past medical history on file. There are no problems to display for this patient.  Home Medication(s) Prior to Admission medications   Not on File                                                                                                                                    Past Surgical History ** The histories are not reviewed yet. Please review them in the "History" navigator section and refresh this SmartLink. Family History No family history on file.  Social History   Allergies Patient has no known allergies.  Review of Systems Review of Systems All other systems are reviewed and are negative for acute change except as noted in the HPI  Physical Exam Vital Signs  I have reviewed the triage vital signs BP (!) 145/100 (BP Location: Right Arm)   Pulse 72   Temp 98.6 F (37 C) (Oral)   Resp (!) 22   Ht 5\' 9"  (1.753 m)   Wt 83.9 kg   LMP 04/13/2020   SpO2 100%   BMI 27.32 kg/m   Physical Exam Vitals reviewed. Exam conducted with a chaperone present.  Constitutional:      General: She is not in acute distress.    Appearance: She is well-developed. She is not diaphoretic.  HENT:     Head: Normocephalic and  atraumatic.     Right Ear: External ear normal.     Left Ear: External ear normal.     Nose: Nose normal.  Eyes:     General: No scleral icterus.    Conjunctiva/sclera: Conjunctivae normal.  Neck:     Trachea: Phonation normal.  Cardiovascular:     Rate and Rhythm: Normal rate and regular rhythm.  Pulmonary:     Effort: Pulmonary effort is normal. No respiratory distress.     Breath sounds: No stridor.  Abdominal:     General: There is no distension.     Tenderness: There is abdominal tenderness in the suprapubic area and left lower quadrant. There is guarding. There is no rebound.     Hernia: No hernia is present.  Genitourinary:    Vagina: Vaginal discharge (mild, blood  tinged) present.     Cervix: No cervical motion tenderness, discharge, friability, lesion or erythema.     Uterus: Tender.      Adnexa:        Right: No tenderness.         Left: No tenderness.    Musculoskeletal:        General: Normal range of motion.     Cervical back: Normal range of motion.  Skin:    General: Skin is cool and moist.  Neurological:     Mental Status: She is alert and oriented to person, place, and time.  Psychiatric:        Behavior: Behavior normal.     ED Results and Treatments Labs (all labs ordered are listed, but only abnormal results are displayed) Labs Reviewed  I-STAT BETA HCG BLOOD, ED ([facility], [facility], [facility] ONLY) - Abnormal; Notable for the following components:      Result Value   I-stat hCG, quantitative >2,000.0 (*)    All other components within normal limits  WET PREP, GENITAL  CBC WITH DIFFERENTIAL/PLATELET  COMPREHENSIVE METABOLIC PANEL  URINALYSIS, ROUTINE W REFLEX MICROSCOPIC  GC/CHLAMYDIA PROBE AMP ([place]) NOT AT [facility]                                                                                                                         EKG  EKG Interpretation  Date/Time:    Ventricular Rate:    PR Interval:    QRS Duration:   QT Interval:    QTC  Calculation:   R Axis:     Text Interpretation:        Radiology No results found.  Pertinent labs & imaging results that were available during my care of the patient were reviewed by me and considered in my medical decision making (see chart for details).  Medications Ordered in ED Medications  ondansetron (ZOFRAN) injection 4 mg (4 mg Intravenous Given 05/10/20 0553)  sodium chloride 0.9 % bolus 1,000 mL (1,000 mLs Intravenous New Bag/Given 05/10/20 0554)  morphine 4 MG/ML injection 4 mg (4 mg Intravenous Given 05/10/20 0554)                                                                                                                                    Procedures .Critical Care Performed by: Nira Conn, MD Authorized by: Nira Conn, MD  Critical care provider statement:    Critical care time (minutes):  45   Critical care was time spent personally by me on the following activities:  Discussions with consultants, evaluation of patient's response to treatment, examination of patient, ordering and performing treatments and interventions, ordering and review of laboratory studies, ordering and review of radiographic studies, pulse oximetry, re-evaluation of patient's condition, obtaining history from patient or surrogate and review of old charts    (including critical care time)  Medical Decision Making / ED Course I have reviewed the nursing notes for this encounter and the patient's prior records (if available in EHR or on provided paperwork).   [name] was evaluated in Emergency Department on 05/10/2020 for the symptoms described in the history of present illness. She was evaluated in the context of the global COVID-19 pandemic, which necessitated consideration that the patient might be at risk for infection with the SARS-CoV-2 virus that causes COVID-19. Institutional protocols and algorithms that pertain to the evaluation of patients at risk for  COVID-19 are in a state of rapid change based on information released by regulatory bodies including the CDC and federal and state organizations. These policies and algorithms were followed during the patient's care in the ED.  Lower abdominal pain. Will need to rule out ectopic pregnancy, ovarian torsion, PID, urinary tract infection.  Also considering appendicitis, versus hernia.  Patient's iStat hCG positive. [place] ordered and pending.  Patient care turned over to Dr Freida Busman. Patient case and results discussed in detail; please see their note for further ED managment.        This chart was dictated using voice recognition software.  Despite best efforts to proofread,  errors can occur which can change the documentation meaning.   Nira Conn, MD 05/11/20 [phone]

## 2020-05-10 NOTE — ED Notes (Signed)
This nurse spoke with Shann Medal, RN at [facility], [place]. Confirmed with Shann Medal, RN, pt has not had anything to eat or drink since 6:00pm on 05/09/20.

## 2020-05-10 NOTE — ED Notes (Signed)
Notified [facility] that pt needs to go emergent transport to [facility].

## 2020-05-11 ENCOUNTER — Encounter (HOSPITAL_COMMUNITY): Payer: Self-pay | Admitting: Obstetrics and Gynecology

## 2020-05-11 LAB — BPAM RBC
Blood Product Expiration Date: 202205252359
Blood Product Expiration Date: 202205252359
Blood Product Expiration Date: 202205262359
ISSUE DATE / TIME: 202205011303
ISSUE DATE / TIME: 202205011303
Unit Type and Rh: 6200
Unit Type and Rh: 6200
Unit Type and Rh: 6200

## 2020-05-11 LAB — TYPE AND SCREEN
ABO/RH(D): A POS
Antibody Screen: NEGATIVE
Unit division: 0
Unit division: 0
Unit division: 0

## 2020-05-11 LAB — POCT I-STAT, CHEM 8
BUN: 11 mg/dL (ref 6–20)
Calcium, Ion: 1.1 mmol/L — ABNORMAL LOW (ref 1.15–1.40)
Chloride: 107 mmol/L (ref 98–111)
Creatinine, Ser: 0.7 mg/dL (ref 0.44–1.00)
Glucose, Bld: 136 mg/dL — ABNORMAL HIGH (ref 70–99)
HCT: 24 % — ABNORMAL LOW (ref 36.0–46.0)
Hemoglobin: 8.2 g/dL — ABNORMAL LOW (ref 12.0–15.0)
Potassium: 5.6 mmol/L — ABNORMAL HIGH (ref 3.5–5.1)
Sodium: 138 mmol/L (ref 135–145)
TCO2: 19 mmol/L — ABNORMAL LOW (ref 22–32)

## 2020-05-11 LAB — GC/CHLAMYDIA PROBE AMP (~~LOC~~) NOT AT ARMC
Chlamydia: NEGATIVE
Comment: NEGATIVE
Comment: NORMAL
Neisseria Gonorrhea: NEGATIVE

## 2020-05-11 LAB — PREPARE RBC (CROSSMATCH)

## 2020-05-12 LAB — SURGICAL PATHOLOGY

## 2020-05-12 NOTE — Anesthesia Postprocedure Evaluation (Signed)
Anesthesia Post Note  Patient: Alison Morales  Procedure(s) Performed: DIAGNOSTIC LAPAROSCOPY (N/A ) LAPAROSCOPIC LEFT SALPINGECTOMY WITH REMOVAL OF ECTOPIC PREGNANCY (Left )     Patient location during evaluation: PACU Anesthesia Type: General Level of consciousness: awake and alert Pain management: pain level controlled Vital Signs Assessment: post-procedure vital signs reviewed and stable Respiratory status: spontaneous breathing, nonlabored ventilation, respiratory function stable and patient connected to nasal cannula oxygen Cardiovascular status: blood pressure returned to baseline and stable Postop Assessment: no apparent nausea or vomiting Anesthetic complications: no   No complications documented.        Shelton Silvas

## 2020-05-27 ENCOUNTER — Ambulatory Visit (INDEPENDENT_AMBULATORY_CARE_PROVIDER_SITE_OTHER): Payer: PRIVATE HEALTH INSURANCE | Admitting: Obstetrics and Gynecology

## 2020-05-27 ENCOUNTER — Other Ambulatory Visit: Payer: Self-pay

## 2020-05-27 ENCOUNTER — Encounter: Payer: Self-pay | Admitting: Obstetrics and Gynecology

## 2020-05-27 VITALS — BP 120/64 | HR 109 | Ht 69.0 in | Wt 194.8 lb

## 2020-05-27 DIAGNOSIS — Z8759 Personal history of other complications of pregnancy, childbirth and the puerperium: Secondary | ICD-10-CM | POA: Insufficient documentation

## 2020-05-27 DIAGNOSIS — Z09 Encounter for follow-up examination after completed treatment for conditions other than malignant neoplasm: Secondary | ICD-10-CM

## 2020-05-27 HISTORY — DX: Personal history of other complications of pregnancy, childbirth and the puerperium: Z87.59

## 2020-05-27 NOTE — Progress Notes (Signed)
[facility] 05/27/2020  CC: regular post op visit  Subjective:   20 y/o G1P0010 s/p 5/1 l/s left salpingectomy for ectopic pregnancy with hemoperitoneum. Pt was discharged from the pacu. Final pathology benign with ectopic pregnancy  Patient is doing great since surgery; no period yet.  Objective:    BP 120/64   Pulse (!) 109   Ht 5\' 9"  (1.753 m)   Wt 194 lb 12.8 oz (88.4 kg)   LMP 04/10/2020 (Exact Date)   BMI 28.77 kg/m   NAD Abdomen: soft, nttp, nd, +BS, c/d/i incision Assessment:    Doing well postoperatively.    Plan:   No issues. Recommend continue folic acid since they want to try again and I recommend waiting until next period. I also said to tell 06/10/2020 ASAP when she's pregnant in the future as she is high risk for rpt ectopic on the other side and will need to follow her early with u/s and serial labs  RTC PRN  [place] MD Attending [facility])

## 2020-09-29 ENCOUNTER — Emergency Department (HOSPITAL_COMMUNITY)
Admission: EM | Admit: 2020-09-29 | Discharge: 2020-09-29 | Disposition: A | Discharge status: Home / Self Care | Payer: PRIVATE HEALTH INSURANCE | Attending: Emergency Medicine | Admitting: Emergency Medicine

## 2020-09-29 ENCOUNTER — Ambulatory Visit
Admission: EM | Admit: 2020-09-29 | Discharge: 2020-09-29 | Disposition: A | Discharge status: Home / Self Care | Payer: PRIVATE HEALTH INSURANCE

## 2020-09-29 DIAGNOSIS — R112 Nausea with vomiting, unspecified: Secondary | ICD-10-CM | POA: Diagnosis not present

## 2020-09-29 DIAGNOSIS — Z5321 Procedure and treatment not carried out due to patient leaving prior to being seen by health care provider: Secondary | ICD-10-CM | POA: Diagnosis not present

## 2020-09-29 DIAGNOSIS — R1031 Right lower quadrant pain: Secondary | ICD-10-CM

## 2020-09-29 DIAGNOSIS — N9489 Other specified conditions associated with female genital organs and menstrual cycle: Secondary | ICD-10-CM | POA: Diagnosis not present

## 2020-09-29 LAB — I-STAT BETA HCG BLOOD, ED (MC, WL, AP ONLY): I-stat hCG, quantitative: >2000 m[IU]/mL — ABNORMAL HIGH (ref <5)

## 2020-09-29 LAB — COMPREHENSIVE METABOLIC PANEL
ALT: 18 U/L (ref 0–44)
AST: 20 U/L (ref 15–41)
Albumin: 4.1 g/dL (ref 3.5–5.0)
Alkaline Phosphatase: 43 U/L (ref 38–126)
Anion gap: 14 (ref 5–15)
BUN: 12 mg/dL (ref 6–20)
CO2: 18 mmol/L — ABNORMAL LOW (ref 22–32)
Calcium: 9.4 mg/dL (ref 8.9–10.3)
Chloride: 104 mmol/L (ref 98–111)
Creatinine, Ser: 0.87 mg/dL (ref 0.44–1.00)
GFR, Estimated: >60 mL/min (ref >60)
Glucose, Bld: 97 mg/dL (ref 70–99)
Potassium: 3.6 mmol/L (ref 3.5–5.1)
Sodium: 136 mmol/L (ref 135–145)
Total Bilirubin: 0.4 mg/dL (ref 0.3–1.2)
Total Protein: 7.6 g/dL (ref 6.5–8.1)

## 2020-09-29 LAB — CBC WITH DIFFERENTIAL/PLATELET
Abs Immature Granulocytes: 0.03 10*3/uL (ref 0.00–0.07)
Basophils Absolute: 0 10*3/uL (ref 0.0–0.1)
Basophils Relative: 0 %
Eosinophils Absolute: 0.1 10*3/uL (ref 0.0–0.5)
Eosinophils Relative: 1 %
HCT: 41.5 % (ref 36.0–46.0)
Hemoglobin: 14.1 g/dL (ref 12.0–15.0)
Immature Granulocytes: 0 %
Lymphocytes Relative: 22 %
Lymphs Abs: 1.5 10*3/uL (ref 0.7–4.0)
MCH: 29.8 pg (ref 26.0–34.0)
MCHC: 34 g/dL (ref 30.0–36.0)
MCV: 87.7 fL (ref 80.0–100.0)
Monocytes Absolute: 0.5 10*3/uL (ref 0.1–1.0)
Monocytes Relative: 7 %
Neutro Abs: 4.7 10*3/uL (ref 1.7–7.7)
Neutrophils Relative %: 70 %
Platelets: 314 10*3/uL (ref 150–400)
RBC: 4.73 MIL/uL (ref 3.87–5.11)
RDW: 13.2 % (ref 11.5–15.5)
WBC: 6.7 10*3/uL (ref 4.0–10.5)
nRBC: 0 % (ref 0.0–0.2)

## 2020-09-29 LAB — LIPASE, BLOOD: Lipase: 26 U/L (ref 11–51)

## 2020-09-29 MED ORDER — ONDANSETRON 4 MG PO TBDP
4.0000 mg | ORAL_TABLET | Freq: Once | ORAL | Status: AC
  Administered 2020-09-29: 4 mg via ORAL
  Filled 2020-09-29: qty 1

## 2020-09-29 MED ORDER — ACETAMINOPHEN 325 MG PO TABS
325.0000 mg | ORAL_TABLET | Freq: Once | ORAL | Status: AC
  Administered 2020-09-29: 325 mg via ORAL
  Filled 2020-09-29: qty 1

## 2020-09-29 NOTE — ED Provider Notes (Signed)
Emergency Medicine Provider Triage Evaluation Note  Alison Morales , a 20 y.o. female  was evaluated in triage.  Pt complains of abd pain that started yesterday. Nauseous and vomitting bile. Ectopic pregnancy in May. No hx of IBS/IBD or gallbladder dz   Review of Systems  Positive: Abd pain, NV, chills Negative: Fevers  Physical Exam  BP (!) 114/100 (BP Location: Right Arm)   Pulse 66   Resp 16   Ht 5\' 10"  (1.778 m)   LMP 09/21/2020 Comment: Pt report heavy bleeding and clotsz  SpO2 100%   BMI 27.95 kg/m  Gen:   Awake, no distress   Resp:  Normal effort  MSK:   Moves extremities without difficulty  Other:  RUQ tenderness  Medical Decision Making  Medically screening exam initiated at 10:33 AM.  Appropriate orders placed.  Alison Morales was informed that the remainder of the evaluation will be completed by another provider, this initial triage assessment does not replace that evaluation, and the importance of remaining in the ED until their evaluation is complete.     Delight Hoh 09/29/20 1048    10/01/20, MD 09/29/20 1059

## 2020-09-29 NOTE — ED Triage Notes (Signed)
Pt arrived by EMS was sent from UC, pt is having RLQ pain that started yesterday  N/V states it look like bile that keeps getting darker.  Is currenlty on period, hx of ectopic in may

## 2020-09-29 NOTE — Discharge Instructions (Signed)
Sent to ED via EMS for r/o appendicitis

## 2020-09-29 NOTE — ED Provider Notes (Signed)
[facility]    CSN: [id number] Arrival date & time: 09/29/20  0905      History   Chief Complaint Chief Complaint  Patient presents with   Abdominal Pain    HPI Alison Morales is a 20 y.o. female presenting today for evaluation of abdominal pain.  Acute onset earlier this morning, severe and located in right lower quadrant.  Associated nausea and vomiting.  Denies hematemesis.  Denies change in bowels.  Is currently on menstrual cycle.  History of recent ruptured ectopic pregnancy May 2022 and had removal of left ovary and fallopian tube.  HPI  Past Medical History:  Diagnosis Date   Asthma    History of ectopic pregnancy 05/27/2020    There are no problems to display for this patient.   Past Surgical History:  Procedure Laterality Date   ABDOMINAL HERNIA REPAIR     as a child   DIAGNOSTIC LAPAROSCOPY WITH REMOVAL OF ECTOPIC PREGNANCY N/A 05/10/2020   Procedure: DIAGNOSTIC LAPAROSCOPY;  Surgeon: Oaklyn Bing, MD;  Location: [facility] OR;  Service: Gynecology;  Laterality: N/A;   LAPAROSCOPIC UNILATERAL SALPINGECTOMY Left 05/10/2020   Procedure: LAPAROSCOPIC LEFT SALPINGECTOMY WITH REMOVAL OF ECTOPIC PREGNANCY;  Surgeon: Casper Mountain Bing, MD;  Location: [facility];  Service: Gynecology;  Laterality: Left;    OB History     Gravida  1   Para      Term      Preterm      AB  1   Living         SAB      IAB      Ectopic  1   Multiple      Live Births               Home Medications    Prior to Admission medications   Medication Sig Start Date End Date Taking? Authorizing Provider  ferrous sulfate (FERROUSUL) 325 (65 FE) MG tablet Take 1 tablet (325 mg total) by mouth every other day. Patient not taking: Reported on 05/27/2020 05/10/20   Mora Bing, MD  oxyCODONE-acetaminophen (PERCOCET/ROXICET) 5-325 MG tablet Take 1-2 tablets by mouth every 6 (six) hours as needed. Patient not taking: Reported on 05/27/2020 05/10/20    Bing, MD     Family History History reviewed. No pertinent family history.  Social History Social History   Tobacco Use   Smoking status: Never   Smokeless tobacco: Never  Substance Use Topics   Alcohol use: Never   Drug use: Never     Allergies   Patient has no known allergies.   Review of Systems Review of Systems  Constitutional:  Negative for fever.  Respiratory:  Negative for shortness of breath.   Cardiovascular:  Negative for chest pain.  Gastrointestinal:  Positive for abdominal pain, nausea and vomiting. Negative for diarrhea.  Genitourinary:  Negative for dysuria, flank pain, genital sores, hematuria, menstrual problem, vaginal bleeding, vaginal discharge and vaginal pain.  Musculoskeletal:  Negative for back pain.  Skin:  Negative for rash.  Neurological:  Negative for dizziness, light-headedness and headaches.    Physical Exam Triage Vital Signs ED Triage Vitals [09/29/20 0915]  Enc Vitals Group     BP 134/82     Pulse Rate 78     Resp (!) 22     Temp 97.6 F (36.4 C)     Temp Source Oral     SpO2 99 %     Weight      Height  Head Circumference      Peak Flow      Pain Score 10     Pain Loc      Pain Edu?      Excl. in GC?    No data found.  Updated Vital Signs BP 134/82 (BP Location: Right Arm)   Pulse 78   Temp 97.6 F (36.4 C) (Oral)   Resp (!) 22   LMP 09/21/2020 Comment: Pt report heavy bleeding and clotsz  SpO2 99%   Visual Acuity Right Eye Distance:   Left Eye Distance:   Bilateral Distance:    Right Eye Near:   Left Eye Near:    Bilateral Near:     Physical Exam Vitals and nursing note reviewed.  Constitutional:      Appearance: She is well-developed.     Comments: Sitting in wheelchair, diaphoretic, appears uncomfortable  HENT:     Head: Normocephalic and atraumatic.     Nose: Nose normal.  Eyes:     Conjunctiva/sclera: Conjunctivae normal.  Cardiovascular:     Rate and Rhythm: Normal rate and regular rhythm.   Pulmonary:     Effort: Pulmonary effort is normal. No respiratory distress.     Comments: Breathing comfortably at rest, CTABL, no wheezing, rales or other adventitious sounds auscultated  Abdominal:     General: There is no distension.     Comments: Significant tenderness to light palpation in right lower quadrant; actively dry heaving  Musculoskeletal:        General: Normal range of motion.     Cervical back: Neck supple.  Skin:    General: Skin is warm and dry.  Neurological:     Mental Status: She is alert and oriented to person, place, and time.     UC Treatments / Results  Labs (all labs ordered are listed, but only abnormal results are displayed) Labs Reviewed - No data to display  EKG   Radiology No results found.  Procedures Procedures (including critical care time)  Medications Ordered in UC Medications - No data to display  Initial Impression / Assessment and Plan / UC Course  I have reviewed the triage vital signs and the nursing notes.  Pertinent labs & imaging results that were available during my care of the patient were reviewed by me and considered in my medical decision making (see chart for details).     Acute onset right lower quadrant pain with vomiting-recommending further evaluation for possible appendicitis in emergency room.  Patient requesting EMS instead of going via private vehicle, EMS called.  Final Clinical Impressions(s) / UC Diagnoses   Final diagnoses:  Right lower quadrant pain  Non-intractable vomiting with nausea, unspecified vomiting type     Discharge Instructions      Sent to ED via EMS for r/o appendicitis     ED Prescriptions   None    PDMP not reviewed this encounter.   Lew Dawes, [place] 09/29/20 [phone]

## 2020-09-29 NOTE — ED Notes (Signed)
Unable to update VS. Pt. Did not respond when name was called.

## 2020-09-29 NOTE — ED Notes (Signed)
EMS activated.  

## 2020-09-29 NOTE — ED Provider Notes (Signed)
Received call from radiology who advised they cannot do the CT scan because patient is pregnant. Beta hcg > 2000.  Discussed with [facility] APP who agreed to take patient transfer.  Lawonda Pretlow, PA-C   Jeanella Flattery 09/29/20 1840    Tegeler, Canary Brim, MD 09/29/20 Mikle Bosworth

## 2020-09-29 NOTE — ED Notes (Signed)
Patient is being discharged from the Urgent Care and sent to the Emergency Department via EMS . Per H. Wieters PA-C, patient is in need of higher level of care due to RLQ pain, r/o appendicitis (per provider). Patient is aware and verbalizes understanding of plan of care.  Vitals:   09/29/20 0915  BP: 134/82  Pulse: 78  Resp: (!) 22  Temp: 97.6 F (36.4 C)  SpO2: 99%

## 2020-09-29 NOTE — ED Triage Notes (Addendum)
Pt reports abd pain to right side, going down right leg that started this morning. Patient reports vomiting, still has her appendix, and denies back pain. LBM this morning  [facility]: (heavy, goldball clots) 09/21/2020 still on cycle  -Ectopic pregnancy surg 5 months ago

## 2020-10-21 ENCOUNTER — Other Ambulatory Visit: Payer: Self-pay

## 2020-10-21 ENCOUNTER — Inpatient Hospital Stay (HOSPITAL_COMMUNITY)
Admission: AD | Admit: 2020-10-21 | Discharge: 2020-10-21 | Disposition: A | Discharge status: Home / Self Care | Payer: Medicaid Other | Attending: Obstetrics and Gynecology | Admitting: Obstetrics and Gynecology

## 2020-10-21 ENCOUNTER — Inpatient Hospital Stay (HOSPITAL_COMMUNITY): Payer: Medicaid Other

## 2020-10-21 ENCOUNTER — Encounter (HOSPITAL_COMMUNITY): Payer: Self-pay | Admitting: Obstetrics and Gynecology

## 2020-10-21 ENCOUNTER — Ambulatory Visit (HOSPITAL_COMMUNITY)
Admission: EM | Admit: 2020-10-21 | Discharge: 2020-10-21 | Disposition: A | Discharge status: Home / Self Care | Payer: PRIVATE HEALTH INSURANCE

## 2020-10-21 DIAGNOSIS — Z87891 Personal history of nicotine dependence: Secondary | ICD-10-CM | POA: Insufficient documentation

## 2020-10-21 DIAGNOSIS — R109 Unspecified abdominal pain: Secondary | ICD-10-CM | POA: Diagnosis present

## 2020-10-21 DIAGNOSIS — Z3A11 11 weeks gestation of pregnancy: Secondary | ICD-10-CM | POA: Insufficient documentation

## 2020-10-21 DIAGNOSIS — O09291 Supervision of pregnancy with other poor reproductive or obstetric history, first trimester: Secondary | ICD-10-CM | POA: Diagnosis not present

## 2020-10-21 DIAGNOSIS — O418X10 Other specified disorders of amniotic fluid and membranes, first trimester, not applicable or unspecified: Secondary | ICD-10-CM

## 2020-10-21 DIAGNOSIS — O208 Other hemorrhage in early pregnancy: Secondary | ICD-10-CM | POA: Insufficient documentation

## 2020-10-21 DIAGNOSIS — O468X1 Other antepartum hemorrhage, first trimester: Secondary | ICD-10-CM

## 2020-10-21 DIAGNOSIS — N939 Abnormal uterine and vaginal bleeding, unspecified: Secondary | ICD-10-CM

## 2020-10-21 LAB — HCG, QUANTITATIVE, PREGNANCY: hCG, Beta Chain, Quant, S: 66280 m[IU]/mL — ABNORMAL HIGH (ref <5)

## 2020-10-21 LAB — CBC WITH DIFFERENTIAL/PLATELET
Abs Immature Granulocytes: 0.01 10*3/uL (ref 0.00–0.07)
Basophils Absolute: 0 10*3/uL (ref 0.0–0.1)
Basophils Relative: 0 %
Eosinophils Absolute: 0.1 10*3/uL (ref 0.0–0.5)
Eosinophils Relative: 2 %
HCT: 37.9 % (ref 36.0–46.0)
Hemoglobin: 12.8 g/dL (ref 12.0–15.0)
Immature Granulocytes: 0 %
Lymphocytes Relative: 33 %
Lymphs Abs: 2 10*3/uL (ref 0.7–4.0)
MCH: 29.1 pg (ref 26.0–34.0)
MCHC: 33.8 g/dL (ref 30.0–36.0)
MCV: 86.1 fL (ref 80.0–100.0)
Monocytes Absolute: 0.5 10*3/uL (ref 0.1–1.0)
Monocytes Relative: 9 %
Neutro Abs: 3.4 10*3/uL (ref 1.7–7.7)
Neutrophils Relative %: 56 %
Platelets: 337 10*3/uL (ref 150–400)
RBC: 4.4 MIL/uL (ref 3.87–5.11)
RDW: 12.8 % (ref 11.5–15.5)
WBC: 6 10*3/uL (ref 4.0–10.5)
nRBC: 0 % (ref 0.0–0.2)

## 2020-10-21 LAB — COMPREHENSIVE METABOLIC PANEL
ALT: 18 U/L (ref 0–44)
AST: 20 U/L (ref 15–41)
Albumin: 3.8 g/dL (ref 3.5–5.0)
Alkaline Phosphatase: 40 U/L (ref 38–126)
Anion gap: 11 (ref 5–15)
BUN: 9 mg/dL (ref 6–20)
CO2: 20 mmol/L — ABNORMAL LOW (ref 22–32)
Calcium: 8.9 mg/dL (ref 8.9–10.3)
Chloride: 104 mmol/L (ref 98–111)
Creatinine, Ser: 0.77 mg/dL (ref 0.44–1.00)
GFR, Estimated: >60 mL/min (ref >60)
Glucose, Bld: 85 mg/dL (ref 70–99)
Potassium: 3.3 mmol/L — ABNORMAL LOW (ref 3.5–5.1)
Sodium: 135 mmol/L (ref 135–145)
Total Bilirubin: 0.4 mg/dL (ref 0.3–1.2)
Total Protein: 7.1 g/dL (ref 6.5–8.1)

## 2020-10-21 NOTE — MAU Provider Note (Addendum)
History     CSN: [id number]  Arrival date and time: 10/21/20 1918   Event Date/Time   First Provider Initiated Contact with Patient 10/21/20 2015      Chief Complaint  Patient presents with   Abdominal Pain   Vaginal Bleeding   HPI  Ms.Alison Morales is a 20 y.o. female G3P0020 @ [redacted]w[redacted]d here in MAU with complaints of abdominal pain. The pain started yesterday evening. The pain progressed yesterday. The pain comes and goes. She took tylenol last night which helped her cramping. She rates her pain 1/10. She is having nausea, and some vomiting. She also reports bleeding that started yesterday that is light pink in color. Today the bleeding was more bed in color, like a period. The bleeding upon arrival was much lighter.   OB History     Gravida  3   Para      Term      Preterm      AB  2   Living         SAB  1   IAB      Ectopic  1   Multiple      Live Births              Past Medical History:  Diagnosis Date   Asthma    History of ectopic pregnancy 05/27/2020    Past Surgical History:  Procedure Laterality Date   ABDOMINAL HERNIA REPAIR     as a child   DIAGNOSTIC LAPAROSCOPY WITH REMOVAL OF ECTOPIC PREGNANCY N/A 05/10/2020   Procedure: DIAGNOSTIC LAPAROSCOPY;  Surgeon: [place] Bing, MD;  Location: [facility] OR;  Service: Gynecology;  Laterality: N/A;   LAPAROSCOPIC UNILATERAL SALPINGECTOMY Left 05/10/2020   Procedure: LAPAROSCOPIC LEFT SALPINGECTOMY WITH REMOVAL OF ECTOPIC PREGNANCY;  Surgeon:  Bing, MD;  Location: [facility] OR;  Service: Gynecology;  Laterality: Left;    Family History  Problem Relation Age of Onset   Depression Mother    Depression Father    Schizophrenia Maternal Grandfather     Social History   Tobacco Use   Smoking status: Former    Types: Cigarettes   Smokeless tobacco: Never  Vaping Use   Vaping Use: Never used  Substance Use Topics   Alcohol use: Never   Drug use: Never    Allergies: No Known  Allergies  Medications Prior to Admission  Medication Sig Dispense Refill Last Dose   ferrous sulfate (FERROUSUL) 325 (65 FE) MG tablet Take 1 tablet (325 mg total) by mouth every other day. (Patient not taking: No sig reported) 20 tablet 1 Unknown   oxyCODONE-acetaminophen (PERCOCET/ROXICET) 5-325 MG tablet Take 1-2 tablets by mouth every 6 (six) hours as needed. (Patient not taking: No sig reported) 20 tablet 0 Unknown   Results for orders placed or performed during the hospital encounter of 10/21/20 (from the past 48 hour(s))  CBC with Differential/Platelet     Status: None   Collection Time: 10/21/20  7:56 PM  Result Value Ref Range   WBC 6.0 4.0 - 10.5 K/uL   RBC 4.40 3.87 - 5.11 MIL/uL   Hemoglobin 12.8 12.0 - 15.0 g/dL   HCT 51.7 61.6 - 07.3 %   MCV 86.1 80.0 - 100.0 fL   MCH 29.1 26.0 - 34.0 pg   MCHC 33.8 30.0 - 36.0 g/dL   RDW 71.0 62.6 - 94.8 %   Platelets 337 150 - 400 K/uL   nRBC 0.0 0.0 - 0.2 %   Neutrophils Relative %  56 %   Neutro Abs 3.4 1.7 - 7.7 K/uL   Lymphocytes Relative 33 %   Lymphs Abs 2.0 0.7 - 4.0 K/uL   Monocytes Relative 9 %   Monocytes Absolute 0.5 0.1 - 1.0 K/uL   Eosinophils Relative 2 %   Eosinophils Absolute 0.1 0.0 - 0.5 K/uL   Basophils Relative 0 %   Basophils Absolute 0.0 0.0 - 0.1 K/uL   Immature Granulocytes 0 %   Abs Immature Granulocytes 0.01 0.00 - 0.07 K/uL    Comment: Performed at [facility], [address] [place], [place] [address]    Review of Systems  Gastrointestinal:  Positive for abdominal pain.  Genitourinary:  Positive for vaginal bleeding.  Physical Exam   Blood pressure 125/81, pulse 96, temperature 98.2 F (36.8 C), temperature source Oral, resp. rate 20, height 5\' 10"  (1.778 m), weight 93.7 kg, last menstrual period 09/21/2020, SpO2 100 %.  Physical Exam Vitals and nursing note reviewed.  Constitutional:      General: She is not in acute distress.    Appearance: She is well-developed. She is not  ill-appearing, toxic-appearing or diaphoretic.  HENT:     Head: Normocephalic.  Abdominal:     Tenderness: There is generalized abdominal tenderness.  Genitourinary:    Comments: Wet prep and GC collected  Dark red blood noted on swabs Collected by 11/21/2020, NP  Skin:    General: Skin is warm.  Neurological:     Mental Status: She is alert and oriented to person, place, and time.   MAU Course  Procedures None  MDM  A positive blood type. Wet prep & GC HIV, CBC, Hcg Venia Carbon OB transvaginal   Patient in [place], report given to [place] CNM who resumes care of the patient.  Rasch, Thressa Sheller, NP  Results for orders placed or performed during the hospital encounter of 10/21/20 (from the past 24 hour(s))  CBC with Differential/Platelet     Status: None   Collection Time: 10/21/20  7:56 PM  Result Value Ref Range   WBC 6.0 4.0 - 10.5 K/uL   RBC 4.40 3.87 - 5.11 MIL/uL   Hemoglobin 12.8 12.0 - 15.0 g/dL   HCT 12/21/20 24.4 - 01.0 %   MCV 86.1 80.0 - 100.0 fL   MCH 29.1 26.0 - 34.0 pg   MCHC 33.8 30.0 - 36.0 g/dL   RDW 27.2 53.6 - 64.4 %   Platelets 337 150 - 400 K/uL   nRBC 0.0 0.0 - 0.2 %   Neutrophils Relative % 56 %   Neutro Abs 3.4 1.7 - 7.7 K/uL   Lymphocytes Relative 33 %   Lymphs Abs 2.0 0.7 - 4.0 K/uL   Monocytes Relative 9 %   Monocytes Absolute 0.5 0.1 - 1.0 K/uL   Eosinophils Relative 2 %   Eosinophils Absolute 0.1 0.0 - 0.5 K/uL   Basophils Relative 0 %   Basophils Absolute 0.0 0.0 - 0.1 K/uL   Immature Granulocytes 0 %   Abs Immature Granulocytes 0.01 0.00 - 0.07 K/uL  Comprehensive metabolic panel     Status: Abnormal   Collection Time: 10/21/20  7:56 PM  Result Value Ref Range   Sodium 135 135 - 145 mmol/L   Potassium 3.3 (L) 3.5 - 5.1 mmol/L   Chloride 104 98 - 111 mmol/L   CO2 20 (L) 22 - 32 mmol/L   Glucose, Bld 85 70 - 99 mg/dL   BUN 9 6 - 20 mg/dL  Creatinine, Ser 0.77 0.44 - 1.00 mg/dL   Calcium 8.9 8.9 - 90.9 mg/dL   Total Protein 7.1 6.5  - 8.1 g/dL   Albumin 3.8 3.5 - 5.0 g/dL   AST 20 15 - 41 U/L   ALT 18 0 - 44 U/L   Alkaline Phosphatase 40 38 - 126 U/L   Total Bilirubin 0.4 0.3 - 1.2 mg/dL   GFR, Estimated >31 >12 mL/min   Anion gap 11 5 - 15   US OB Comp Less 14 Wks  Result Date: 10/21/2020 CLINICAL DATA:  Vaginal bleeding x1 day EXAM: OBSTETRIC <14 WK ULTRASOUND TECHNIQUE: Transabdominal ultrasound was performed for evaluation of the gestation as well as the maternal uterus and adnexal regions. COMPARISON:  May 10, 2020 FINDINGS: Intrauterine gestational sac: Single Yolk sac:  Not Visualized. Embryo:  Visualized. Cardiac Activity: Visualized. Heart Rate: 182 bpm CRL:   45.1 mm   11 w 2 d                  [place] EDC: May 10, 2021 Subchorionic hemorrhage:  Small Maternal uterus/adnexae: The bilateral ovaries are visualized and are normal in appearance. There is a small amount of pelvic free fluid. IMPRESSION: Single, viable intrauterine pregnancy at approximately 11 weeks and 2 days gestation by ultrasound evaluation. Electronically Signed   By: Aram Candela M.D.   On: 10/21/2020 20:49     Assessment and Plan   1. [redacted] weeks gestation of pregnancy   2. Vaginal bleeding   3. Subchorionic hematoma in first trimester, single or unspecified fetus    DC home 1st Trimester precautions  Bleeding precautions RX: none  Return to MAU as needed FU with OB as planned   Follow-up Information     Department, [facility] Follow up.   Contact information: [address] [place] [address] [phone]                Thressa Sheller DNP, CNM  10/21/20  9:16 PM

## 2020-10-21 NOTE — MAU Note (Signed)
Received 20 y/o, G3P0 4 2/7 wks, pt c/o vaginal bleeding x 1day, lower abdominal cramping,pain 6/10,c/o intermittent N/V x 3 weeks, support person at bedside.

## 2020-10-21 NOTE — ED Notes (Signed)
Positive bhcg blood work.  Vaginal bleeding.  Spoke to dr hagler, [facility]  Family member with patient.  Both agreeable

## 2020-10-21 NOTE — MAU Note (Signed)
..  Alison Morales is a 20 y.o. at Unknown here in MAU reporting: vaginal bleeding that began this morning, it was spotting this morning and now it is more period-like. Reports abdominal cramping this morning but none now.  LMP: 09/21/2020  Pain score: 0/10 Vitals:   10/21/20 1933  BP: (!) 141/81  Pulse: 98  Resp: 20  Temp: 99 F (37.2 C)  SpO2: 100%      Lab orders placed from triage: UA

## 2020-10-26 ENCOUNTER — Ambulatory Visit: Payer: PRIVATE HEALTH INSURANCE

## 2020-12-18 ENCOUNTER — Other Ambulatory Visit: Payer: Self-pay

## 2020-12-18 ENCOUNTER — Ambulatory Visit (INDEPENDENT_AMBULATORY_CARE_PROVIDER_SITE_OTHER): Payer: Medicaid Other | Admitting: Obstetrics and Gynecology

## 2020-12-18 ENCOUNTER — Other Ambulatory Visit (HOSPITAL_COMMUNITY)
Admission: RE | Admit: 2020-12-18 | Discharge: 2020-12-18 | Disposition: A | Discharge status: Home / Self Care | Payer: Medicaid Other | Source: Ambulatory Visit | Attending: Obstetrics and Gynecology | Admitting: Obstetrics and Gynecology

## 2020-12-18 ENCOUNTER — Encounter: Payer: Self-pay | Admitting: Obstetrics and Gynecology

## 2020-12-18 VITALS — BP 133/68 | HR 104 | Wt 224.8 lb

## 2020-12-18 DIAGNOSIS — Z3A19 19 weeks gestation of pregnancy: Secondary | ICD-10-CM | POA: Diagnosis not present

## 2020-12-18 DIAGNOSIS — O0932 Supervision of pregnancy with insufficient antenatal care, second trimester: Secondary | ICD-10-CM

## 2020-12-18 DIAGNOSIS — E669 Obesity, unspecified: Secondary | ICD-10-CM | POA: Insufficient documentation

## 2020-12-18 DIAGNOSIS — O0990 Supervision of high risk pregnancy, unspecified, unspecified trimester: Secondary | ICD-10-CM

## 2020-12-18 DIAGNOSIS — O99210 Obesity complicating pregnancy, unspecified trimester: Secondary | ICD-10-CM

## 2020-12-18 MED ORDER — BLOOD PRESSURE KIT DEVI: 1.0000 | 0 refills | Status: AC | PRN

## 2020-12-18 NOTE — Patient Instructions (Addendum)
Your Due Date is May 1st Today you are 19 weeks and 4 days

## 2020-12-18 NOTE — Progress Notes (Signed)
New OB Note  12/18/2020   Clinic: [facility]  Chief Complaint: new OB  Transfer of Care Patient: no  History of Present Illness: Alison Morales is a 20 y.o. G3P0020 @ 194 weeks (EDC may 1, based on 11wk u/s) Patient's last menstrual period was 09/21/2020. Preg complicated by has Supervision of high risk pregnancy, antepartum; Obesity in pregnancy; and BMI 30s on their problem list.   She was using no method when she conceived.  She has Negative signs or symptoms of nausea/vomiting of pregnancy. She has Negative signs or symptoms of miscarriage or preterm labor On any medications around the time she conceived/early pregnancy: No   ROS: A 12-point review of systems was performed and negative, except as stated in the above HPI.  OBGYN History: As per HPI. OB History  Gravida Para Term Preterm AB Living  3       2    SAB IAB Ectopic Multiple Live Births  1   1        # Outcome Date GA Lbr Len/2nd Weight Sex Delivery Anes PTL Lv  3 Current           2 Ectopic 05/2020          1 SAB             History of pap smears: No.    Past Medical History: Past Medical History:  Diagnosis Date   Asthma    History of ectopic pregnancy 05/27/2020    Past Surgical History: Past Surgical History:  Procedure Laterality Date   ABDOMINAL HERNIA REPAIR     as a child   DIAGNOSTIC LAPAROSCOPY WITH REMOVAL OF ECTOPIC PREGNANCY N/A 05/10/2020   Procedure: DIAGNOSTIC LAPAROSCOPY;  Surgeon: [place] Bing, MD;  Location: [facility] OR;  Service: Gynecology;  Laterality: N/A;   LAPAROSCOPIC UNILATERAL SALPINGECTOMY Left 05/10/2020   Procedure: LAPAROSCOPIC LEFT SALPINGECTOMY WITH REMOVAL OF ECTOPIC PREGNANCY;  Surgeon: [place] Bing, MD;  Location: [facility] OR;  Service: Gynecology;  Laterality: Left;    Family History:  Family History  Problem Relation Age of Onset   Depression Mother    Depression Father    Schizophrenia Maternal Grandfather    She denies any  female cancers, bleeding or blood clotting disorders.  She denies any history of mental retardation, birth defects or genetic disorders in her or the FOB's history  Social History:  Social History   Socioeconomic History   Marital status: Single    Spouse name: Not on file   Number of children: Not on file   Years of education: Not on file   Highest education level: Not on file  Occupational History   Not on file  Tobacco Use   Smoking status: Former    Types: Cigarettes   Smokeless tobacco: Never  Vaping Use   Vaping Use: Never used  Substance and Sexual Activity   Alcohol use: Never   Drug use: Never   Sexual activity: Yes  Other Topics Concern   Not on file  Social History Narrative   Not on file   Social Determinants of Health   Financial Resource Strain: Not on file  Food Insecurity: Not on file  Transportation Needs: Not on file  Physical Activity: Not on file  Stress: Not on file  Social Connections: Not on file  Intimate Partner Violence: Not on file    Allergy: No Known Allergies   Current Outpatient Medications: Prenatal vitamin  Physical Exam:   BP 133/68  Pulse (!) 104   Wt 224 lb 12.8 oz (102 kg)   LMP 09/21/2020 Comment: Pt report heavy bleeding and clotsz  BMI 32.26 kg/m  Body mass index is 32.26 kg/m. Contractions: Not present Vag. Bleeding: None. FHTs: 150s  General appearance: Well nourished, well developed female in no acute distress.  Neck:  Supple, normal appearance, and no thyromegaly  Cardiovascular: S1, S2 normal, no murmur, rub or gallop, regular rate and rhythm Respiratory:  Clear to auscultation bilateral. Normal respiratory effort Abdomen: positive bowel sounds and no masses, hernias; diffusely non tender to palpation, non distended Breasts: patient denies any breast s/s. Neuro/Psych:  Normal mood and affect.  Skin:  Warm and dry.  Lymphatic:  No inguinal lymphadenopathy.   Pelvic exam: is not limited by body  habitus EGBUS: within normal limits, Vagina: within normal limits and with no blood in the vault, Cervix: normal appearing cervix without discharge or lesions, closed/long/high, Uterus:  enlarged, c/w 18-20 week size, and Adnexa:  normal adnexa and no mass, fullness, tenderness  Laboratory: none  Imaging:  Narrative & Impression  CLINICAL DATA:  Vaginal bleeding x1 day   EXAM: OBSTETRIC <14 WK ULTRASOUND   TECHNIQUE: Transabdominal ultrasound was performed for evaluation of the gestation as well as the maternal uterus and adnexal regions.   COMPARISON:  May 10, 2020   FINDINGS: Intrauterine gestational sac: Single   Yolk sac:  Not Visualized.   Embryo:  Visualized.   Cardiac Activity: Visualized.   Heart Rate: 182 bpm   CRL:   45.1 mm   11 w 2 d                  [place] EDC: May 10, 2021   Subchorionic hemorrhage:  Small   Maternal uterus/adnexae: The bilateral ovaries are visualized and are normal in appearance.   There is a small amount of pelvic free fluid.   IMPRESSION: Single, viable intrauterine pregnancy at approximately 11 weeks and 2 days gestation by ultrasound evaluation.     Electronically Signed   By: Aram Candela M.D.   On: 10/21/2020 20:49    Assessment: pt doing well  Plan: 1. Supervision of high risk pregnancy, antepartum Routine care.  - Cytology - PAP( [place]) - Cervicovaginal ancillary only( [place]) - Genetic Screening - Hemoglobin A1c - Culture, OB Urine - CBC/D/Plt+RPR+Rh+ABO+RubIgG... - [place] MFM OB DETAIL +14 WK; Future - Comprehensive metabolic panel - Protein / creatinine ratio, urine - TSH - AFP, Serum, Open Spina Bifida  2. [redacted] weeks gestation of pregnancy  3. Obesity in pregnancy TWG 25-35lbs  4. BMI 30s  Problem list reviewed and updated.  Follow up in 3 weeks.  The nature of  - [facility] with multiple MDs and other Advanced Practice Providers was explained to patient;  also emphasized that residents, students are part of our team.  >50% of 40 min visit spent on counseling and coordination of care.     Cornelia Copa MD Attending Center for [facility])

## 2020-12-19 LAB — PROTEIN / CREATININE RATIO, URINE
Creatinine, Urine: 218.7 mg/dL
Protein, Ur: 16.5 mg/dL
Protein/Creat Ratio: 75 mg/g creat (ref 0–200)

## 2020-12-20 LAB — AFP, SERUM, OPEN SPINA BIFIDA
AFP MoM: 1.84
AFP Value: 82.3 ng/mL
Gest. Age on Collection Date: 19 weeks
Maternal Age At EDD: 22.4 yr
OSBR Risk 1 IN: 2324
Test Results:: NEGATIVE
Weight: 224 [lb_av]

## 2020-12-20 LAB — COMPREHENSIVE METABOLIC PANEL
ALT: 16 IU/L (ref 0–32)
AST: 15 IU/L (ref 0–40)
Albumin/Globulin Ratio: 1.5 (ref 1.2–2.2)
Albumin: 4 g/dL (ref 3.9–5.0)
Alkaline Phosphatase: 67 IU/L (ref 44–121)
BUN/Creatinine Ratio: 12 (ref 9–23)
BUN: 8 mg/dL (ref 6–20)
Bilirubin Total: <0.2 mg/dL (ref 0.0–1.2)
CO2: 21 mmol/L (ref 20–29)
Calcium: 9.5 mg/dL (ref 8.7–10.2)
Chloride: 102 mmol/L (ref 96–106)
Creatinine, Ser: 0.69 mg/dL (ref 0.57–1.00)
Globulin, Total: 2.7 g/dL (ref 1.5–4.5)
Glucose: 89 mg/dL (ref 70–99)
Potassium: 4.3 mmol/L (ref 3.5–5.2)
Sodium: 138 mmol/L (ref 134–144)
Total Protein: 6.7 g/dL (ref 6.0–8.5)
eGFR: 126 mL/min/{1.73_m2} (ref >59)

## 2020-12-20 LAB — CBC/D/PLT+RPR+RH+ABO+RUBIGG...
Antibody Screen: NEGATIVE
Basophils Absolute: 0 10*3/uL (ref 0.0–0.2)
Basos: 0 %
EOS (ABSOLUTE): 0.1 10*3/uL (ref 0.0–0.4)
Eos: 2 %
HCV Ab: <0.1 s/co ratio (ref 0.0–0.9)
HIV Screen 4th Generation wRfx: NONREACTIVE
Hematocrit: 35.4 % (ref 34.0–46.6)
Hemoglobin: 12.3 g/dL (ref 11.1–15.9)
Hepatitis B Surface Ag: NEGATIVE
Immature Grans (Abs): 0 10*3/uL (ref 0.0–0.1)
Immature Granulocytes: 1 %
Lymphocytes Absolute: 1.6 10*3/uL (ref 0.7–3.1)
Lymphs: 23 %
MCH: 29.7 pg (ref 26.6–33.0)
MCHC: 34.7 g/dL (ref 31.5–35.7)
MCV: 86 fL (ref 79–97)
Monocytes Absolute: 0.5 10*3/uL (ref 0.1–0.9)
Monocytes: 7 %
Neutrophils Absolute: 4.6 10*3/uL (ref 1.4–7.0)
Neutrophils: 67 %
Platelets: 303 10*3/uL (ref 150–450)
RBC: 4.14 x10E6/uL (ref 3.77–5.28)
RDW: 12.6 % (ref 11.7–15.4)
RPR Ser Ql: NONREACTIVE
Rh Factor: POSITIVE
Rubella Antibodies, IGG: 1.04 index (ref >0.99)
WBC: 6.8 10*3/uL (ref 3.4–10.8)

## 2020-12-20 LAB — HEMOGLOBIN A1C
Est. average glucose Bld gHb Est-mCnc: 97 mg/dL
Hgb A1c MFr Bld: 5 % (ref 4.8–5.6)

## 2020-12-20 LAB — URINE CULTURE, OB REFLEX

## 2020-12-20 LAB — TSH: TSH: 0.724 u[IU]/mL (ref 0.450–4.500)

## 2020-12-20 LAB — HCV INTERPRETATION

## 2020-12-20 LAB — CULTURE, OB URINE

## 2020-12-21 LAB — CERVICOVAGINAL ANCILLARY ONLY
Bacterial Vaginitis (gardnerella): NEGATIVE
Candida Glabrata: NEGATIVE
Candida Vaginitis: NEGATIVE
Chlamydia: NEGATIVE
Comment: NEGATIVE
Comment: NEGATIVE
Comment: NEGATIVE
Comment: NEGATIVE
Comment: NEGATIVE
Comment: NORMAL
Neisseria Gonorrhea: NEGATIVE
Trichomonas: NEGATIVE

## 2020-12-22 LAB — CYTOLOGY - PAP
Comment: NEGATIVE
Diagnosis: NEGATIVE
High risk HPV: NEGATIVE

## 2021-01-02 ENCOUNTER — Other Ambulatory Visit: Payer: Self-pay

## 2021-01-02 ENCOUNTER — Encounter (HOSPITAL_COMMUNITY): Payer: Self-pay

## 2021-01-02 ENCOUNTER — Emergency Department (HOSPITAL_COMMUNITY)
Admission: EM | Admit: 2021-01-02 | Discharge: 2021-01-02 | Disposition: A | Discharge status: Home / Self Care | Payer: No Typology Code available for payment source | Attending: Emergency Medicine | Admitting: Emergency Medicine

## 2021-01-02 DIAGNOSIS — Z87891 Personal history of nicotine dependence: Secondary | ICD-10-CM | POA: Insufficient documentation

## 2021-01-02 DIAGNOSIS — O99512 Diseases of the respiratory system complicating pregnancy, second trimester: Secondary | ICD-10-CM | POA: Insufficient documentation

## 2021-01-02 DIAGNOSIS — J45909 Unspecified asthma, uncomplicated: Secondary | ICD-10-CM | POA: Insufficient documentation

## 2021-01-02 DIAGNOSIS — Y92410 Unspecified street and highway as the place of occurrence of the external cause: Secondary | ICD-10-CM | POA: Insufficient documentation

## 2021-01-02 DIAGNOSIS — O9A212 Injury, poisoning and certain other consequences of external causes complicating pregnancy, second trimester: Secondary | ICD-10-CM | POA: Insufficient documentation

## 2021-01-02 DIAGNOSIS — R109 Unspecified abdominal pain: Secondary | ICD-10-CM | POA: Diagnosis not present

## 2021-01-02 DIAGNOSIS — Z3492 Encounter for supervision of normal pregnancy, unspecified, second trimester: Secondary | ICD-10-CM

## 2021-01-02 LAB — CBC WITH DIFFERENTIAL/PLATELET
Abs Immature Granulocytes: 0.03 10*3/uL (ref 0.00–0.07)
Basophils Absolute: 0 10*3/uL (ref 0.0–0.1)
Basophils Relative: 0 %
Eosinophils Absolute: 0.2 10*3/uL (ref 0.0–0.5)
Eosinophils Relative: 2 %
HCT: 37 % (ref 36.0–46.0)
Hemoglobin: 12.6 g/dL (ref 12.0–15.0)
Immature Granulocytes: 0 %
Lymphocytes Relative: 28 %
Lymphs Abs: 2.4 10*3/uL (ref 0.7–4.0)
MCH: 29.9 pg (ref 26.0–34.0)
MCHC: 34.1 g/dL (ref 30.0–36.0)
MCV: 87.9 fL (ref 80.0–100.0)
Monocytes Absolute: 0.7 10*3/uL (ref 0.1–1.0)
Monocytes Relative: 8 %
Neutro Abs: 5.3 10*3/uL (ref 1.7–7.7)
Neutrophils Relative %: 62 %
Platelets: 307 10*3/uL (ref 150–400)
RBC: 4.21 MIL/uL (ref 3.87–5.11)
RDW: 13.2 % (ref 11.5–15.5)
WBC: 8.6 10*3/uL (ref 4.0–10.5)
nRBC: 0 % (ref 0.0–0.2)

## 2021-01-02 LAB — COMPREHENSIVE METABOLIC PANEL
ALT: 17 U/L (ref 0–44)
AST: 18 U/L (ref 15–41)
Albumin: 3.4 g/dL — ABNORMAL LOW (ref 3.5–5.0)
Alkaline Phosphatase: 62 U/L (ref 38–126)
Anion gap: 8 (ref 5–15)
BUN: 7 mg/dL (ref 6–20)
CO2: 22 mmol/L (ref 22–32)
Calcium: 9.6 mg/dL (ref 8.9–10.3)
Chloride: 105 mmol/L (ref 98–111)
Creatinine, Ser: 0.7 mg/dL (ref 0.44–1.00)
GFR, Estimated: >60 mL/min (ref >60)
Glucose, Bld: 82 mg/dL (ref 70–99)
Potassium: 3.7 mmol/L (ref 3.5–5.1)
Sodium: 135 mmol/L (ref 135–145)
Total Bilirubin: 0.5 mg/dL (ref 0.3–1.2)
Total Protein: 6.8 g/dL (ref 6.5–8.1)

## 2021-01-02 LAB — URINALYSIS, ROUTINE W REFLEX MICROSCOPIC
Bilirubin Urine: NEGATIVE
Glucose, UA: NEGATIVE mg/dL
Hgb urine dipstick: NEGATIVE
Ketones, ur: NEGATIVE mg/dL
Leukocytes,Ua: NEGATIVE
Nitrite: NEGATIVE
Protein, ur: NEGATIVE mg/dL
Specific Gravity, Urine: <1.005 — ABNORMAL LOW (ref 1.005–1.030)
pH: 6.5 (ref 5.0–8.0)

## 2021-01-02 LAB — PREGNANCY, URINE: Preg Test, Ur: POSITIVE — AB

## 2021-01-02 MED ORDER — LACTATED RINGERS IV BOLUS
1000.0000 mL | Freq: Once | INTRAVENOUS | Status: AC
  Administered 2021-01-02: 18:00:00 1000 mL via INTRAVENOUS
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
## 2021-01-02 NOTE — ED Notes (Signed)
Per OB nurse , cleared from OB standpoint
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
## 2021-01-02 NOTE — Discharge Instructions (Signed)
You were observed in the ED for several hours and right now your fetal heart rate is normal and you are not having contractions  Please stay hydrated and take Tylenol as needed for pain.  Return to ER if you have vaginal discharge or bleeding, severe abdominal pain or vomiting or headaches.
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
## 2021-01-15 ENCOUNTER — Encounter: Payer: Self-pay | Admitting: Family Medicine

## 2021-01-15 ENCOUNTER — Other Ambulatory Visit: Payer: Self-pay

## 2021-01-15 ENCOUNTER — Ambulatory Visit (INDEPENDENT_AMBULATORY_CARE_PROVIDER_SITE_OTHER): Payer: Medicaid Other | Admitting: Family

## 2021-01-15 VITALS — BP 122/76 | HR 112 | Wt 231.7 lb

## 2021-01-15 DIAGNOSIS — Z3A23 23 weeks gestation of pregnancy: Secondary | ICD-10-CM

## 2021-01-15 DIAGNOSIS — O0990 Supervision of high risk pregnancy, unspecified, unspecified trimester: Secondary | ICD-10-CM

## 2021-01-15 NOTE — Patient Instructions (Signed)
AREA PEDIATRIC/FAMILY PRACTICE PHYSICIANS  [place] ([address]) [facility] Chambliss, MD; Eniola, MD; Hale, MD; Hensel, MD; McDiarmid, MD; McIntyer, MD; Neal, MD; Walden, MD [address] [place], [place] [address] [phone] Mon-Fri 8:30-12:30, 1:30-5:00 Providers come to see babies at [facility] Accepting Medicaid [facility] providers who accept newborns: Koirala, MD; Morrow, MD; Wolters, MD [address], [place], [place] [address] [phone] Mon-Fri 8:00-5:30 Babies seen by providers at [facility] Does NOT accept Medicaid Please call early in hospitalization for appointment (limited availability)  [facility] Mulberry, MD [address] [place], [place] [address] [phone] Mon, Tue, Thur, Fri 8:30-5:00, Wed 10:00-7:00 (closed 1-2pm) Babies seen by [facility] providers Accepting Medicaid Rubin - Pediatrician Rubin, MD [address], [place], [place] [address] [phone] Mon-Fri 8:30-5:00, Sat 8:30-12:00 Provider comes to see babies at [facility] Accepting Medicaid Must have been referred from current patients or contacted office prior to delivery [facility] ([facility]) Brown, MD; Chandler, MD; Ettefagh, MD; Grant, MD; Lester, MD; McCormick, MD; McQueen, MD; Prose, MD; Simha, MD; Stanley, MD; Stryffeler, NP; Tebben, NP [address], [place], [place] [address] [phone] Mon, Tue, Thur, Fri 8:30-5:30, Wed 9:30-5:30, Sat 8:30-12:30 Babies seen by [facility] providers Accepting Medicaid Only accepting infants of first-time parents or siblings of current patients Hospital discharge coordinator will make follow-up appointment Jack Amos [address], [place], [place]  [address] [phone]   Fax - [phone] [facility] [address], [place], [place]  [address] Phone - [phone]   Fax - [phone] Shilpa Gosrani [address], [place], [place]  [address] [phone]  [place] ([address]) [facility] Bates, MD; Brassfield, MD; Cooper, Cox, MD; MD; Davis, MD; Dovico, MD; Ettefaugh, MD; Little, MD; Lowe, MD; Keiffer, MD; Melvin, MD; Sumner, MD; Williams, MD [address], [place], [place] [address] [phone] Mon-Fri 8:30-5:00 (extended evenings Mon-Thur as needed), Sat-Sun 10:00-1:00 Providers come to see babies at [facility] Accepting Medicaid for families of first-time babies and families with all children in the household age 3 and under. Must register with office prior to making appointment (M-F only). [facility] Henson, NP; Knapp, MD; Lalonde, MD; Tysinger, PA [address] [place], [place] [address] [phone] Mon-Fri 8:00-5:00 Babies seen by providers at [facility] Does NOT accept Medicaid/Commercial Insurance Only [facility] ([facility])  Artis, MD; Barnes, MD; Bratton, MD; Coccaro, MD; [place] Gardner, MD; Kramer, MD; Marshall, MD; Netherton, MD; Poleto, MD; Skinner, MD [address] [place], [place] [address] [phone] Mon-Fri 8:30-5:30, Sat (Oct.-Mar.) 9:00-1:00 Babies seen by providers at [facility] Accepting Medicaid  [place] ([address]) [facility] Reid, MD; Warner, MD [address], [place], [place] [address] [phone] Mon-Fri 8:30-5:00, Sat 8:30-12:00 Providers come to see babies at [facility] Does NOT accept Medicaid [facility] Becker, PA; Hagler, MD; Scifres, PA; Sun, MD; Swayne, MD [address], [place], [place] [address] [phone] Mon-Fri 8:00-5:00 Babies seen by providers at [facility] Does NOT accept Medicaid Only accepting babies of parents who  are patients Please call early in hospitalization for appointment (limited availability) [facility] Clark, MD; Frye, MD; Kelleher, MD; Mack, NP; Miller, MD; O'Keller, MD; Patterson, NP; Pudlo, MD; Puzio, MD; Bergfeld, MD; Tucker, MD; Twiselton, MD [address], [place], [place] [address] [phone] Mon-Fri 8:00-5:00, Sat 9:00-12:00 Providers come to see babies at [facility] Does NOT accept Medicaid  [place] ([address]) [facility] providers accepting new patients: Brake, NP; Wharton, PA [address], [place], [place] [address] [phone] Mon-Fri 8:00-5:00 Babies seen by providers at [facility] Does NOT accept Medicaid Only accepting babies of parents who are patients Please call early in hospitalization for appointment (limited availability) [facility] Gay, MD; Quinlan, MD [address] [place], [place] [address] [phone] (press 1 to schedule appointment) Mon-Fri 8:00-5:00 Providers come to see babies at [facility] Does NOT accept Medicaid [facility] Mazer, MD [address] [place], [place] [address] [phone] Mon-Fri 8:30-5:00 (lunch 12:30-1:00), extended hours by appointment only Wed 5:00-6:30 Babies seen by [facility] providers Accepting Medicaid [facility] Banks, MD; Jordan, MD; Koberlein, MD [address], [place], [place] [address] [phone] Mon-Fri 8:00-5:00 Babies seen by [facility] providers Does NOT accept Medicaid [facility] Parker, MD; Hunter, MD; Wallace, DO [address] [place], [place] [address] [phone] Mon-Fri 8:00-5:00 Babies seen by [facility] providers Does NOT accept Medicaid [facility] Brandon, PA; Brecken, PA; Christy, NP; Dees, MD; DeClaire, MD; DeWeese, MD; Hansen, NP; Mills, NP; Parrish, NP; Smoot, NP; Summer, MD; Vapne,  MD [address] [place], [place] [address] [phone] Mon-Fri 8:30-5:00, Sat 10:00-1:00 Providers come to see babies at [facility] Does NOT accept Medicaid Free prenatal information session Tuesdays at 4:45pm [facility] Bouska, MD; Gordon, PA; Jeffery, PA; Weber, PA [address] [place] [address] [phone] Mon-Fri 7:30-5:30 Babies seen by [facility] providers [facility] [address], [place], [place]  [address] [phone]   Fax - [phone]  [place] ([address] & [address]) [facility] [place] [address] [place], [place] [address] [phone] Mon-Thur 8:00-6:00 Providers come to see babies at [facility] Accepting Medicaid [facility] Anderson, NP; Badger, MD; Beal, PA; Spencer, PA [address] [place], [place] [address] [phone] Mon-Thur 7:30-7:30, Fri 7:30-4:30 Babies seen by [facility] providers Accepting Medicaid [facility] Agbuya, MD; Klett, NP; Romgoolam, MD [address], [place], [place] [address] [phone] Mon-Fri 8:30-5:00, Sat 8:30-12:00 Providers come to see babies at [facility] Accepting Medicaid Must have "Meet & Greet" appointment at office prior to delivery [facility] ([facility]) McCord, MD; Wallace, MD; Wood, MD [address], [place], [place] [address] [phone] Mon-Wed 8:00-6:00, Thur-Fri 8:00-5:00, Sat 9:00-12:00 Providers come to see babies at [facility] Does NOT accept Medicaid Only accepting siblings of current patients [facility]  [address], [place], [place]  [address] [phone]   Fax - [phone] [facility] [address], [place], [place]  [address] [phone]   Fax -  [phone]  [place] ([address] & [address]) [facility] Cirigliano, DO; Matthews, DO [address] [place], [place] [address] [phone] Mon-Fri 7:00-5:00 Babies seen by [facility] providers Does NOT accept Medicaid [facility] Briscoe, MD; Howley, PA; Moreira, PA [address], [place], [place] [address] [phone] Mon-Fri 8:00-5:00 Babies seen by [facility] providers Accepting Medicaid [facility] Boyd, MD; Church, PA; Jones, NP; Osborn, PA [address], [place], [place] [address] [phone] Mon-Fri 8:00-5:00 Babies seen by providers at [facility] Accepting Medicaid  [place] ([address]) [facility] [place] Wendling, DO [address] [place], [place] [address] [phone] Mon-Fri 8:00-5:00 Babies seen by [facility] providers Does NOT accept Medicaid Limited availability, please call early in hospitalization to schedule follow-up [facility] Calderon, PA; Cummings, MD; Dillard, MD; Martin, PA; Olson, MD; VanDeven, PA [address], [place], [place] [address] [phone] Mon-Fri 8:30-5:00, Sat 9:00-12:00 Babies seen by providers at [facility] Accepting Medicaid Please register online then schedule online or call office [email or web address] ([facility]) Hunter, NP; Kumar, MD; Martin Rogers, PA [address], [place], [place] [address] [phone] Mon-Fri 8:00-5:00 Babies seen by providers at [facility] Accepting Medicaid [facility] ([facility]) Arapaho, MD; Kristi Fleenor, NP; West, MD [address], [place], [place] [address] [phone] Mon-Fri 8:00-5:30, Sat&Sun by appointment (phones open at  8:30) Babies seen by [facility] providers Accepting Medicaid Must be a first-time baby or sibling of current patient [facility] [place]  [address], [place], [place]  [address] [phone]   Fax - [phone]  [place] ([address]) [facility] Brown, PA; Cowen, PA; Rice, MD; Helton, PA; Spry, MD [address] [place], [place] [address] [phone] Mon-Thur 8:00-7:00, Fri 8:00-5:00, Sat 8:00-12:00, Sun 9:00-12:00 Babies seen by [facility] providers Accepting Medicaid [facility] [place] Coe-Goins, MD; Marshall, MD; Pierre-Louis, MD [address] [place] [address], [place], [place] [address] [phone] Mon-Thur 8:00-5:00 Babies seen by providers at [facility] Accepting Medicaid [facility] Bratton, MD; Coe-Goins, MD; Hayes, MD; Lewis, MD; List, MD; Lott, MD; Marshall, MD; Moran, MD; O'Neal, MD; Pierre-Louis, MD; Pitonzo, MD; Scholer, MD; Spangle, MD [address] [place], [place] [address] [phone] Mon-Fri 8:00-5:30, Sat (Oct.-Mar.) 9:00-1:00 Babies seen by providers at [facility] Accepting Medicaid Must fill out new patient packet, available online at [email or web address] [facility] ([facility]) Friddle, NP; Harris, NP; Kelly, NP; Logan, MD; Melvin, PA; Poth, MD; Ramadoss, MD; Stanton, NP [address], [place], [place] [address] [phone] Mon-Thur 8:00-5:30, Fri 8:00-5:00 Babies seen by providers at [facility] Accepting Medicaid  [place] ([address]) [facility] Dixon, PA; Bristol, MD; Pickard, MD; Tapia, PA [address], [place], [place] [address] [phone] Mon-Fri 8:00-5:00 Babies seen by providers at [facility] Accepting Medicaid   [place] ([address]) [facility] [place] Masneri, DO; Meyers, MD; Nelson, PA [address], [place], [place] [address] [phone] Mon-Fri 8:00-5:00 Babies seen by providers at [facility] Does NOT accept Medicaid Limited appointment availability, please call early in hospitalization  [facility] [place] Kunedd, DO; McGowen, MD [address], [place], [place] [address] [phone] Mon-Fri 8:00-5:00 Babies seen by [facility] providers Does NOT accept Medicaid [facility] [place] Cameron, MD; MacDonald, MD; Michaels, PA; Nayak, MD [address] [place] [address], [place], [place] [address] [phone] Mon-Fri 8:00-5:00 After hours clinic ([address] ,  [address]) [phone] Mon-Fri 5:00-8:00, Sat 12:00-6:00, Sun 10:00-4:00 Babies seen by [facility] providers Accepting Medicaid [facility] [place] [address], [place], [place]  [address] [phone]   Fax - [phone]  [place] ([address]) [facility] [place] [facility] Andy, MD [address], [place], [place] [address] [phone] Mon-Fri 8:00-5:00 Babies seen by [facility] providers Does NOT accept Medicaid [facility] [place] ([facility] [place]) Eksir, MD [address], [place], [place] [address] [phone] Mon-Thur 8:00-7:00, Fri 8:00-5:00, Sat 8:00-12:00 Babies seen by providers at [facility] Accepting Medicaid - but does not have vaccinations in office (must be received elsewhere) Limited availability, please call early in hospitalization  [place] ([address]) [facility]  Charlene Flemming, MD [address], [place] [address] [phone]  Fax [phone]  [facility]  Kimberly Newton, MD, Annamarie Streilein, PA, Carla Hampton, PA [address] [place], [place]  [address] [phone] [facility]  [address], [place], [place] [address] [phone] [address], [place], [place] [address] [phone] (West Office)  [facility] [address], [place], [place] [address] [phone] [facility] [address], [place], [place] [address] [phone] [facility] [address], [place], [place] [address] [phone] [facility] [address], [place], [place] [address] [phone] [facility] [address], [place], [place] [address] [phone] [facility] [address], [place], [place] [address] [phone] [facility]  [address], [place], [place] [address] [phone] Dr[facility] [address] [place], [place], [place] [address] [phone] [facility] [address], [place], [place] [address] [phone] [facility] [address], [place], [place] [address] [phone]  

## 2021-01-15 NOTE — Progress Notes (Signed)
° °  PRENATAL VISIT NOTE  Subjective:  Alison Morales is a 21 y.o. G3P0020 at [redacted]w[redacted]d being seen today for ongoing prenatal care.  Anatomy ultrasound scheduled for 02/20/21.  Here with Alison Morales.  She is currently monitored for the following issues for this low-risk pregnancy and has Supervision of high risk pregnancy, antepartum; Obesity in pregnancy; BMI 30s; and Late prenatal care affecting pregnancy in second trimester on their problem list.  Patient reports no complaints.  Contractions: Irritability. Vag. Bleeding: None.  Movement: Present. Denies leaking of fluid.   The following portions of the patient's history were reviewed and updated as appropriate: allergies, current medications, past family history, past medical history, past social history, past surgical history and problem list.   Objective:   Vitals:   01/15/21 1113  BP: 122/76  Pulse: (!) 112  Weight: 231 lb 11.2 oz (105.1 kg)    Fetal Status: Fetal Heart Rate (bpm): 150   Movement: Present     General:  Alert, oriented and cooperative. Patient is in no acute distress.  Skin: Skin is warm and dry. No rash noted.   Cardiovascular: Normal heart rate noted  Respiratory: Normal respiratory effort, no problems with respiration noted  Abdomen: Soft, gravid, appropriate for gestational age.  Pain/Pressure: Present     Pelvic: Cervical exam deferred        Extremities: Normal range of motion.  Edema: None  Mental Status: Normal mood and affect. Normal behavior. Normal judgment and thought content.   Assessment and Plan:  Pregnancy: G3P0020 at [redacted]w[redacted]d 1. Supervision of high risk pregnancy, antepartum - - Discussed third trimester labs for next visit, come fasting - Given tape measure   Preterm labor symptoms and general obstetric precautions including but not limited to vaginal bleeding, contractions, leaking of fluid and fetal movement were reviewed in detail with the patient. Please refer to After Visit Summary for other  counseling recommendations.   Return in about 4 weeks (around 02/12/2021).  Future Appointments  Date Time Provider Department Center  01/20/2021  8:45 AM [facility] NURSE [facility]  01/20/2021  9:00 AM [facility] US1 [facility]    DHRCBUL Eloisa Northern, CNM

## 2021-01-20 ENCOUNTER — Encounter: Payer: Self-pay | Admitting: *Deleted

## 2021-01-20 ENCOUNTER — Ambulatory Visit: Payer: Medicaid Other | Attending: Obstetrics and Gynecology

## 2021-01-20 ENCOUNTER — Other Ambulatory Visit: Payer: Self-pay | Admitting: *Deleted

## 2021-01-20 ENCOUNTER — Other Ambulatory Visit: Payer: Self-pay

## 2021-01-20 ENCOUNTER — Ambulatory Visit: Payer: Medicaid Other | Admitting: *Deleted

## 2021-01-20 VITALS — BP 124/79 | HR 105

## 2021-01-20 DIAGNOSIS — O0990 Supervision of high risk pregnancy, unspecified, unspecified trimester: Secondary | ICD-10-CM | POA: Insufficient documentation

## 2021-01-20 DIAGNOSIS — Z6830 Body mass index (BMI) 30.0-30.9, adult: Secondary | ICD-10-CM

## 2021-01-20 DIAGNOSIS — O43199 Other malformation of placenta, unspecified trimester: Secondary | ICD-10-CM

## 2021-01-21 ENCOUNTER — Encounter: Payer: Self-pay | Admitting: Obstetrics and Gynecology

## 2021-01-21 DIAGNOSIS — O43199 Other malformation of placenta, unspecified trimester: Secondary | ICD-10-CM | POA: Insufficient documentation

## 2021-02-08 ENCOUNTER — Other Ambulatory Visit: Payer: Self-pay

## 2021-02-08 DIAGNOSIS — O0990 Supervision of high risk pregnancy, unspecified, unspecified trimester: Secondary | ICD-10-CM

## 2021-02-10 ENCOUNTER — Other Ambulatory Visit: Payer: Medicaid Other

## 2021-02-10 ENCOUNTER — Ambulatory Visit (INDEPENDENT_AMBULATORY_CARE_PROVIDER_SITE_OTHER): Payer: Medicaid Other | Admitting: Obstetrics & Gynecology

## 2021-02-10 ENCOUNTER — Other Ambulatory Visit: Payer: Self-pay

## 2021-02-10 VITALS — BP 130/91 | HR 110 | Wt 247.9 lb

## 2021-02-10 DIAGNOSIS — O0932 Supervision of pregnancy with insufficient antenatal care, second trimester: Secondary | ICD-10-CM

## 2021-02-10 DIAGNOSIS — O99210 Obesity complicating pregnancy, unspecified trimester: Secondary | ICD-10-CM

## 2021-02-10 DIAGNOSIS — O0990 Supervision of high risk pregnancy, unspecified, unspecified trimester: Secondary | ICD-10-CM

## 2021-02-10 DIAGNOSIS — Z23 Encounter for immunization: Secondary | ICD-10-CM | POA: Diagnosis not present

## 2021-02-10 DIAGNOSIS — O43199 Other malformation of placenta, unspecified trimester: Secondary | ICD-10-CM

## 2021-02-10 NOTE — Progress Notes (Signed)
° °  PRENATAL VISIT NOTE  Subjective:  Alison Morales is a 21 y.o. G3P0020 at [redacted]w[redacted]d being seen today for ongoing prenatal care.  She is currently monitored for the following issues for this high-risk pregnancy and has Supervision of high risk pregnancy, antepartum; Obesity in pregnancy; BMI 30s; Late prenatal care affecting pregnancy in second trimester; and Marginal insertion of umbilical cord affecting management of mother on their problem list.  Patient reports no complaints.  Contractions: Not present. Vag. Bleeding: None.  Movement: Present. Denies leaking of fluid.   The following portions of the patient's history were reviewed and updated as appropriate: allergies, current medications, past family history, past medical history, past social history, past surgical history and problem list.   Objective:   Vitals:   02/10/21 0832  BP: (!) 130/91  Pulse: (!) 110  Weight: 247 lb 14.4 oz (112.4 kg)    Fetal Status: Fetal Heart Rate (bpm): 152 Fundal Height: 28 cm Movement: Present     General:  Alert, oriented and cooperative. Patient is in no acute distress.  Skin: Skin is warm and dry. No rash noted.   Cardiovascular: Normal heart rate noted  Respiratory: Normal respiratory effort, no problems with respiration noted  Abdomen: Soft, gravid, appropriate for gestational age.  Pain/Pressure: Absent     Pelvic: Cervical exam deferred        Extremities: Normal range of motion.  Edema: Trace  Mental Status: Normal mood and affect. Normal behavior. Normal judgment and thought content.   Assessment and Plan:  Pregnancy: [id number] at [redacted]w[redacted]d 1. Supervision of high risk pregnancy, antepartum 28 week labs - Tdap vaccine greater than or equal to 7yo IM 2 hr GTT 2. Obesity in pregnancy Body mass index is 35.57 kg/m.   3. Late prenatal care affecting pregnancy in second trimester F/u growth [place] next week  4. Marginal insertion of umbilical cord affecting management of mother Normal  growth noted  Preterm labor symptoms and general obstetric precautions including but not limited to vaginal bleeding, contractions, leaking of fluid and fetal movement were reviewed in detail with the patient. Please refer to After Visit Summary for other counseling recommendations.   Return in about 2 weeks (around 02/24/2021).  Future Appointments  Date Time Provider [place]  02/17/2021  3:30 PM [facility] NURSE [facility]  02/17/2021  3:45 PM [facility] US4 [facility]    Emeterio Reeve, MD

## 2021-02-11 ENCOUNTER — Other Ambulatory Visit: Payer: Self-pay

## 2021-02-11 ENCOUNTER — Encounter: Payer: Self-pay | Admitting: Obstetrics and Gynecology

## 2021-02-11 DIAGNOSIS — O24419 Gestational diabetes mellitus in pregnancy, unspecified control: Secondary | ICD-10-CM | POA: Insufficient documentation

## 2021-02-11 DIAGNOSIS — Z8632 Personal history of gestational diabetes: Secondary | ICD-10-CM | POA: Insufficient documentation

## 2021-02-11 LAB — GLUCOSE TOLERANCE, 2 HOURS W/ 1HR
Glucose, 1 hour: 151 mg/dL (ref 70–179)
Glucose, 2 hour: 121 mg/dL (ref 70–152)
Glucose, Fasting: 96 mg/dL — ABNORMAL HIGH (ref 70–91)

## 2021-02-11 LAB — CBC
Hematocrit: 36.7 % (ref 34.0–46.6)
Hemoglobin: 12.4 g/dL (ref 11.1–15.9)
MCH: 29.2 pg (ref 26.6–33.0)
MCHC: 33.8 g/dL (ref 31.5–35.7)
MCV: 86 fL (ref 79–97)
Platelets: 294 10*3/uL (ref 150–450)
RBC: 4.25 x10E6/uL (ref 3.77–5.28)
RDW: 12.8 % (ref 11.7–15.4)
WBC: 9.2 10*3/uL (ref 3.4–10.8)

## 2021-02-11 LAB — RPR: RPR Ser Ql: NONREACTIVE

## 2021-02-11 LAB — HIV ANTIBODY (ROUTINE TESTING W REFLEX): HIV Screen 4th Generation wRfx: NONREACTIVE

## 2021-02-11 MED ORDER — ACCU-CHEK GUIDE VI STRP: ORAL_STRIP | 6 refills | Status: DC

## 2021-02-11 MED ORDER — ACCU-CHEK SOFTCLIX LANCETS MISC: 6 refills | Status: DC

## 2021-02-11 MED ORDER — ACCU-CHEK GUIDE W/DEVICE KIT: 1.0000 | PACK | Freq: Once | 0 refills | Status: AC

## 2021-02-17 ENCOUNTER — Ambulatory Visit: Payer: Medicaid Other | Attending: Obstetrics

## 2021-02-17 ENCOUNTER — Other Ambulatory Visit: Payer: Self-pay

## 2021-02-17 ENCOUNTER — Ambulatory Visit: Payer: Medicaid Other

## 2021-02-17 ENCOUNTER — Ambulatory Visit (HOSPITAL_BASED_OUTPATIENT_CLINIC_OR_DEPARTMENT_OTHER): Payer: Medicaid Other | Admitting: Obstetrics

## 2021-02-17 ENCOUNTER — Ambulatory Visit: Payer: Medicaid Other | Admitting: *Deleted

## 2021-02-17 VITALS — BP 139/74 | HR 111

## 2021-02-17 DIAGNOSIS — O99213 Obesity complicating pregnancy, third trimester: Secondary | ICD-10-CM | POA: Insufficient documentation

## 2021-02-17 DIAGNOSIS — Z3A28 28 weeks gestation of pregnancy: Secondary | ICD-10-CM | POA: Diagnosis not present

## 2021-02-17 DIAGNOSIS — O43193 Other malformation of placenta, third trimester: Secondary | ICD-10-CM

## 2021-02-17 DIAGNOSIS — Z363 Encounter for antenatal screening for malformations: Secondary | ICD-10-CM | POA: Insufficient documentation

## 2021-02-17 DIAGNOSIS — O43199 Other malformation of placenta, unspecified trimester: Secondary | ICD-10-CM

## 2021-02-17 DIAGNOSIS — O0990 Supervision of high risk pregnancy, unspecified, unspecified trimester: Secondary | ICD-10-CM

## 2021-02-17 DIAGNOSIS — O24420 Gestational diabetes mellitus in childbirth, diet controlled: Secondary | ICD-10-CM | POA: Diagnosis not present

## 2021-02-17 DIAGNOSIS — Z6830 Body mass index (BMI) 30.0-30.9, adult: Secondary | ICD-10-CM

## 2021-02-17 NOTE — Progress Notes (Signed)
MFM Note  Alison Morales was seen for a follow up exam due to maternal obesity and a marginal placental cord insertion.  She was diagnosed with diet-controlled gestational diabetes about 2 weeks ago.  She is performing daily fingersticks and reports that her fingerstick values are mostly within normal limits.  She was informed that the fetal growth and amniotic fluid level appears appropriate for her gestational age.  The views of the fetal anatomy remain limited today due to the fetal position.  The following were discussed during our consultation today:  Gestational diabetes and pregnancy  The implications and management of diabetes in pregnancy was discussed in detail with the patient.    She was advised to continue to monitor her fingersticks 4 times daily (fasting and 2 hours after each meal).    She was advised that our goals for her fingerstick values are fasting values of 90-95 or less and two-hour postprandial values of 120 or less.    Should the majority of her fingerstick results be above these values, she may have to be started on insulin or metformin to help her achieve better glycemic control.   The patient was advised that getting her fingerstick values as close to these goals as possible would provide her with the most optimal obstetrical outcome.  The increased risk of polyhydramnios, fetal macrosomia, and preeclampsia associated with diabetes was also discussed.    Due to gestational diabetes, we will continue to follow her with monthly growth ultrasounds.    Weekly fetal testing should be started at 32 weeks should she require insulin or metformin for treatment.  The patient was advised that delivery for well-controlled diabetes in pregnancy is usually recommended at around 39 weeks.  Delivery at 37 weeks may be considered should her glycemic control be poor.  The patient stated that all of her questions have been answered to her satisfaction.    We will continue to  follow her closely with you throughout her pregnancy.  A total of 20 minutes was spent counseling and coordinating the care for this patient.  Greater than 50% of the time was spent in direct face-to-face contact.

## 2021-02-18 ENCOUNTER — Other Ambulatory Visit: Payer: Self-pay | Admitting: *Deleted

## 2021-02-18 DIAGNOSIS — O43199 Other malformation of placenta, unspecified trimester: Secondary | ICD-10-CM

## 2021-02-18 DIAGNOSIS — Z6830 Body mass index (BMI) 30.0-30.9, adult: Secondary | ICD-10-CM

## 2021-02-24 ENCOUNTER — Other Ambulatory Visit: Payer: Self-pay

## 2021-02-24 ENCOUNTER — Encounter: Payer: Self-pay | Admitting: Student

## 2021-02-24 ENCOUNTER — Ambulatory Visit (INDEPENDENT_AMBULATORY_CARE_PROVIDER_SITE_OTHER): Payer: Medicaid Other | Admitting: Student

## 2021-02-24 DIAGNOSIS — O0990 Supervision of high risk pregnancy, unspecified, unspecified trimester: Secondary | ICD-10-CM

## 2021-02-24 MED ORDER — COMFORT FIT MATERNITY SUPP LG MISC: 1.0000 | Freq: Once | 0 refills | Status: AC

## 2021-02-24 MED ORDER — HYDROCORTISONE VALERATE 0.2 % EX CREA: 1.0000 "application " | TOPICAL_CREAM | Freq: Two times a day (BID) | CUTANEOUS | 2 refills | Status: DC

## 2021-02-24 NOTE — Patient Instructions (Signed)
[facility] [employer]  MTM [phone] Mon-Sat 7 AM - 6 PM 48 hours in advance

## 2021-02-24 NOTE — Progress Notes (Signed)
° °  PRENATAL VISIT NOTE  Subjective:  Alison Morales is a 21 y.o. G3P0020 at [redacted]w[redacted]d being seen today for ongoing prenatal care.  She is currently monitored for the following issues for this low-risk pregnancy and has Supervision of high risk pregnancy, antepartum; Obesity in pregnancy; BMI 30s; Late prenatal care affecting pregnancy in second trimester; Marginal insertion of umbilical cord affecting management of mother; and Gestational diabetes mellitus (GDM) affecting pregnancy on their problem list.  Patient reports that she is having outbreaks of ezema and would like a cream for it. She also reports that she is checking her sugars over 4 times per day.   She is Contractions: Not present. Vag. Bleeding: None.  Movement: Present. Denies leaking of fluid.   The following portions of the patient's history were reviewed and updated as appropriate: allergies, current medications, past family history, past medical history, past social history, past surgical history and problem list.   Objective:   Vitals:   02/24/21 1538  BP: 122/79  Pulse: (!) 104  Weight: 245 lb 1.6 oz (111.2 kg)    Fetal Status: Fetal Heart Rate (bpm): 140 Fundal Height: 31 cm Movement: Present     General:  Alert, oriented and cooperative. Patient is in no acute distress.  Skin: Skin is warm and dry. No rash noted.   Cardiovascular: Normal heart rate noted  Respiratory: Normal respiratory effort, no problems with respiration noted  Abdomen: Soft, gravid, appropriate for gestational age.  Pain/Pressure: Present     Pelvic: Cervical exam deferred        Extremities: Normal range of motion.  Edema: None  Mental Status: Normal mood and affect. Normal behavior. Normal judgment and thought content.   Assessment and Plan:  Pregnancy: G3P0020 at [redacted]w[redacted]d 1. Supervision of high risk pregnancy, antepartum -will send ezcema treatment to pharmacy on file  Reviewed patient's blood sugar log: patient reports that she is still  eating a lot of bread and whenever she has bread she notices that her sugar sneaks up.  -keep diabetic educator appt .-value of 180 was from ice cream. At this point, will not start medicine as patient is motivated to change diet and reports that she will stop eating carbs. Encouraged patient not to check more than 4 times per day, and to eat consistent meals and snacks. Advised her that she may need to go on medicine, depending on sugars in the next two weeks.    - Elastic Bandages & Supports (COMFORT FIT MATERNITY SUPP LG) MISC; 1 Device by Does not apply route once for 1 dose.  Dispense: 1 each; Refill: 0  Preterm labor symptoms and general obstetric precautions including but not limited to vaginal bleeding, contractions, leaking of fluid and fetal movement were reviewed in detail with the patient. Please refer to After Visit Summary for other counseling recommendations.   Return in about 2 weeks (around 03/10/2021), or HROB and needs to see MD next visit.  Future Appointments  Date Time Provider Department Center  03/03/2021  9:00 AM [facility] GDM CLASS [facility]  03/10/2021  8:35 AM Anyanwu, Jethro Bastos, MD [facility]  03/17/2021  3:15 PM [facility] NURSE [facility]  03/17/2021  3:30 PM [facility] US3 [facility]    Marylene Land, CNM

## 2021-03-01 ENCOUNTER — Other Ambulatory Visit: Payer: Self-pay | Admitting: Lactation Services

## 2021-03-01 ENCOUNTER — Encounter: Payer: Medicaid Other | Admitting: Nurse Practitioner

## 2021-03-01 MED ORDER — TRIAMCINOLONE ACETONIDE 0.5 % EX OINT: 1.0000 "application " | TOPICAL_OINTMENT | Freq: Two times a day (BID) | CUTANEOUS | 2 refills | Status: DC

## 2021-03-01 NOTE — Progress Notes (Signed)
Changed to Triamcinolone cream for insurance coverage per Dr. Para March.

## 2021-03-03 ENCOUNTER — Encounter: Payer: Self-pay | Admitting: Registered"

## 2021-03-03 ENCOUNTER — Encounter: Payer: Medicaid Other | Attending: Obstetrics and Gynecology | Admitting: Registered"

## 2021-03-03 ENCOUNTER — Other Ambulatory Visit: Payer: Self-pay

## 2021-03-03 DIAGNOSIS — O24419 Gestational diabetes mellitus in pregnancy, unspecified control: Secondary | ICD-10-CM | POA: Diagnosis not present

## 2021-03-03 NOTE — Progress Notes (Signed)
Patient was seen on 03/03/21 for Gestational Diabetes self-management class at the [facility]. The following learning objectives were met by the patient during this course: ° °States the definition of Gestational Diabetes °States why dietary management is important in controlling blood glucose °Describes the effects each nutrient has on blood glucose levels °Demonstrates ability to create a balanced meal plan °Demonstrates carbohydrate counting  °States when to check blood glucose levels °Demonstrates proper blood glucose monitoring techniques °States the effect of stress and exercise on blood glucose levels °States the importance of limiting caffeine and abstaining from alcohol and smoking ° °Blood glucose monitor given: Patient has meter /and is checking blood sugar prior to class ° °Patient instructed to monitor glucose levels: °FBS: 60 - <95; 1 hour: <140; 2 hour: <120 ° °Patient received handouts: °Nutrition Diabetes and Pregnancy, including carb counting list ° °Patient will be seen for follow-up as needed. °

## 2021-03-10 ENCOUNTER — Other Ambulatory Visit: Payer: Self-pay

## 2021-03-10 ENCOUNTER — Ambulatory Visit (INDEPENDENT_AMBULATORY_CARE_PROVIDER_SITE_OTHER): Payer: Medicaid Other | Admitting: Obstetrics & Gynecology

## 2021-03-10 ENCOUNTER — Encounter: Payer: Self-pay | Admitting: Obstetrics & Gynecology

## 2021-03-10 VITALS — BP 134/86 | HR 100 | Wt 257.6 lb

## 2021-03-10 DIAGNOSIS — O0990 Supervision of high risk pregnancy, unspecified, unspecified trimester: Secondary | ICD-10-CM

## 2021-03-10 DIAGNOSIS — Z3A31 31 weeks gestation of pregnancy: Secondary | ICD-10-CM

## 2021-03-10 DIAGNOSIS — O24419 Gestational diabetes mellitus in pregnancy, unspecified control: Secondary | ICD-10-CM

## 2021-03-10 DIAGNOSIS — O43199 Other malformation of placenta, unspecified trimester: Secondary | ICD-10-CM

## 2021-03-10 MED ORDER — METFORMIN HCL 500 MG PO TABS: 500.0000 mg | ORAL_TABLET | Freq: Every day | ORAL | 5 refills | Status: DC

## 2021-03-10 NOTE — Patient Instructions (Signed)
Return to office for any scheduled appointments. Call the office or go to the MAU at [facility] [place] if:  You begin to have strong, frequent contractions  Your water breaks.  Sometimes it is a big gush of fluid, sometimes it is just a trickle that keeps getting your panties wet or running down your legs  You have vaginal bleeding.  It is normal to have a small amount of spotting if your cervix was checked.   You do not feel your baby moving like normal.  If you do not, get something to eat and drink and lay down and focus on feeling your baby move.   If your baby is still not moving like normal, you should call the office or go to MAU.  Any other obstetric concerns.   

## 2021-03-10 NOTE — Progress Notes (Signed)
? ?  PRENATAL VISIT NOTE ? ?Subjective:  ?Alison Morales is a 21 y.o. G3P0020 at [redacted]w[redacted]d being seen today for ongoing prenatal care.  She is currently monitored for the following issues for this high-risk pregnancy and has Supervision of high risk pregnancy, antepartum; Obesity in pregnancy; BMI 30s; Late prenatal care affecting pregnancy in second trimester; Marginal insertion of umbilical cord affecting management of mother; and Gestational diabetes mellitus (GDM) affecting pregnancy on their problem list. ? ?Patient reports no complaints.  Contractions: Not present. Vag. Bleeding: None.  Movement: Present. Denies leaking of fluid.  ? ?The following portions of the patient's history were reviewed and updated as appropriate: allergies, current medications, past family history, past medical history, past social history, past surgical history and problem list.  ? ?Objective:  ? ?Vitals:  ? 03/10/21 0829  ?BP: (!) 152/82  ?Pulse: (!) 102  ?Weight: 257 lb 9.6 oz (116.8 kg)  ? ? ?Fetal Status: Fetal Heart Rate (bpm): 151   Movement: Present    ? ?General:  Alert, oriented and cooperative. Patient is in no acute distress.  ?Skin: Skin is warm and dry. No rash noted.   ?Cardiovascular: Normal heart rate noted  ?Respiratory: Normal respiratory effort, no problems with respiration noted  ?Abdomen: Soft, gravid, appropriate for gestational age.  Pain/Pressure: Present     ?Pelvic: Cervical exam deferred        ?Extremities: Normal range of motion.  Edema: Trace  ?Mental Status: Normal mood and affect. Normal behavior. Normal judgment and thought content.  ? ?Assessment and Plan:  ?Pregnancy: G3P0020 at [redacted]w[redacted]d ?1. Gestational diabetes mellitus (GDM) affecting pregnancy ?Elevated fastings, more than half over 95. PP within range. Counseled about need for treatment, insulin vs Metformin recommended. She desires Metformin. Discussed need for needing antenatal testing, she has a growth scan next week and BPP will be added on to  this, and she will continue weekly testing afterwards. Need to for delivery at 39 weeks (or earlier for other indications) explained.  ?- metFORMIN (GLUCOPHAGE) 500 MG tablet; Take 1 tablet (500 mg total) by mouth at bedtime.  Dispense: 30 tablet; Refill: 5 ?- [place] MFM FETAL BPP WO NON STRESS; Future ? ?2. Marginal insertion of umbilical cord affecting management of mother ?Continue MFM scans as indicated ? ?3. [redacted] weeks gestation of pregnancy ?4. Supervision of high risk pregnancy, antepartum ?No other issues. Preterm labor symptoms and general obstetric precautions including but not limited to vaginal bleeding, contractions, leaking of fluid and fetal movement were reviewed in detail with the patient. ?Please refer to After Visit Summary for other counseling recommendations.  ? ?Return in about 1 week (around 03/17/2021) for OFFICE OB VISIT (MD only), follow up sugars after Metformin start. ? ?Future Appointments  ?Date Time Provider [place]  ?03/17/2021  3:15 PM [facility] NURSE [facility]  ?03/17/2021  3:30 PM [facility] US3 [facility] [place]  ? ? ?Verita Schneiders, MD ? ?

## 2021-03-17 ENCOUNTER — Ambulatory Visit: Payer: Medicaid Other | Admitting: *Deleted

## 2021-03-17 ENCOUNTER — Encounter: Payer: Self-pay | Admitting: Family Medicine

## 2021-03-17 ENCOUNTER — Other Ambulatory Visit: Payer: Self-pay

## 2021-03-17 ENCOUNTER — Ambulatory Visit: Payer: Medicaid Other | Attending: Obstetrics

## 2021-03-17 ENCOUNTER — Ambulatory Visit (INDEPENDENT_AMBULATORY_CARE_PROVIDER_SITE_OTHER): Payer: Medicaid Other | Admitting: Family Medicine

## 2021-03-17 VITALS — BP 130/86 | HR 111 | Wt 253.9 lb

## 2021-03-17 VITALS — BP 119/80 | HR 101

## 2021-03-17 DIAGNOSIS — O0990 Supervision of high risk pregnancy, unspecified, unspecified trimester: Secondary | ICD-10-CM

## 2021-03-17 DIAGNOSIS — Z6830 Body mass index (BMI) 30.0-30.9, adult: Secondary | ICD-10-CM

## 2021-03-17 DIAGNOSIS — O43199 Other malformation of placenta, unspecified trimester: Secondary | ICD-10-CM

## 2021-03-17 DIAGNOSIS — Z363 Encounter for antenatal screening for malformations: Secondary | ICD-10-CM | POA: Diagnosis not present

## 2021-03-17 DIAGNOSIS — O24415 Gestational diabetes mellitus in pregnancy, controlled by oral hypoglycemic drugs: Secondary | ICD-10-CM | POA: Diagnosis not present

## 2021-03-17 DIAGNOSIS — O24419 Gestational diabetes mellitus in pregnancy, unspecified control: Secondary | ICD-10-CM

## 2021-03-17 DIAGNOSIS — O99210 Obesity complicating pregnancy, unspecified trimester: Secondary | ICD-10-CM

## 2021-03-17 DIAGNOSIS — Z3A32 32 weeks gestation of pregnancy: Secondary | ICD-10-CM | POA: Diagnosis not present

## 2021-03-17 DIAGNOSIS — O43193 Other malformation of placenta, third trimester: Secondary | ICD-10-CM | POA: Insufficient documentation

## 2021-03-17 DIAGNOSIS — O99213 Obesity complicating pregnancy, third trimester: Secondary | ICD-10-CM | POA: Diagnosis not present

## 2021-03-17 DIAGNOSIS — E669 Obesity, unspecified: Secondary | ICD-10-CM

## 2021-03-17 MED ORDER — PRENATAL GUMMIES 0.18-25 MG PO CHEW: 1.0000 | CHEWABLE_TABLET | Freq: Every day | ORAL | 3 refills | Status: AC

## 2021-03-17 NOTE — Progress Notes (Signed)
Pt sending Glucose Log thru My Chart message from phone. Pt states has been having a lot of pain in back & pelvic area. ?

## 2021-03-17 NOTE — Progress Notes (Deleted)
? ?  PRENATAL VISIT NOTE ? ?Subjective:  ?Alison Morales is a 21 y.o. G3P0020 at [redacted]w[redacted]d being seen today for ongoing prenatal care.  She is currently monitored for the following issues for this {Blank single:19197::"high-risk","low-risk"} pregnancy and has Supervision of high risk pregnancy, antepartum; Obesity in pregnancy; BMI 30s; Late prenatal care affecting pregnancy in second trimester; Marginal insertion of umbilical cord affecting management of mother; and Gestational diabetes mellitus (GDM) affecting pregnancy on their problem list. ? ?Patient reports {sx:[id number]}.  Contractions: Irritability. Vag. Bleeding: None.  Movement: Present. Denies leaking of fluid.  ? ?The following portions of the patient's history were reviewed and updated as appropriate: allergies, current medications, past family history, past medical history, past social history, past surgical history and problem list.  ? ?Objective:  ? ?Vitals:  ? 03/17/21 1419 03/17/21 1429  ?BP: (!) 143/90 130/86  ?Pulse: (!) 116 (!) 111  ?Weight: 253 lb 14.4 oz (115.2 kg)   ? ? ?Fetal Status: Fetal Heart Rate (bpm): 141   Movement: Present    ? ?General:  Alert, oriented and cooperative. Patient is in no acute distress.  ?Skin: Skin is warm and dry. No rash noted.   ?Cardiovascular: Normal heart rate noted  ?Respiratory: Normal respiratory effort, no problems with respiration noted  ?Abdomen: Soft, gravid, appropriate for gestational age.  Pain/Pressure: Present     ?Pelvic: {Blank single:19197::"Cervical exam performed in the presence of a chaperone","Cervical exam deferred"}        ?Extremities: Normal range of motion.  Edema: None  ?Mental Status: Normal mood and affect. Normal behavior. Normal judgment and thought content.  ? ?Assessment and Plan:  ?Pregnancy: G3P0020 at [redacted]w[redacted]d ?1. Supervision of high risk pregnancy, antepartum ?*** ? ?2. Gestational diabetes mellitus (GDM) affecting pregnancy ?*** ? ?3. Marginal insertion of umbilical cord affecting  management of mother ?*** ? ?4. Obesity in pregnancy ?*** ? ?{Blank single:19197::"Term","Preterm"} labor symptoms and general obstetric precautions including but not limited to vaginal bleeding, contractions, leaking of fluid and fetal movement were reviewed in detail with the patient. ?Please refer to After Visit Summary for other counseling recommendations.  ? ?No follow-ups on file. ? ?Future Appointments  ?Date Time Provider [place]  ?03/17/2021  3:15 PM [facility] NURSE [facility]  ?03/17/2021  3:30 PM [facility] US3 [facility] [place]  ? ? ?Truett Mainland, DO ? ?

## 2021-03-17 NOTE — Patient Instructions (Signed)
AREA PEDIATRIC/FAMILY PRACTICE PHYSICIANS ° °[place] ([address]) °[facility] °Chambliss, MD; Eniola, MD; Hale, MD; Hensel, MD; McDiarmid, MD; McIntyer, MD; Neal, MD; Walden, MD °[address] [place], [place] [address] °[phone] °Mon-Fri 8:30-12:30, 1:30-5:00 °Providers come to see babies at [facility] °Accepting Medicaid °[facility] °Limited providers who accept newborns: Koirala, MD; Morrow, MD; Wolters, MD °[address], [place], [place] [address] °[phone] °Mon-Fri 8:00-5:30 °Babies seen by providers at [facility] °Does NOT accept Medicaid °Please call early in hospitalization for appointment (limited availability)  °[facility] °Mulberry, MD °[address] [place], [place] [address] °[phone] °Mon, Tue, Thur, Fri 8:30-5:00, Wed 10:00-7:00 (closed 1-2pm) °Babies seen by [facility] providers °Accepting Medicaid °[facility] Pediatric[facility] °Rubin, MD °[address], [place], [place] [address] °[phone] °Mon-Fri 8:30-5:00, Sat 8:30-12:00 °Provider comes to see babies at [facility] °Accepting Medicaid °Must have been referred from current patients or contacted office prior to delivery °[facility] ([facility]) °Brown, MD; Chandler, MD; Ettefagh, MD; Grant, MD; Lester, MD; McCormick, MD; McQueen, MD; Prose, MD; Simha, MD; Stanley, MD; Stryffeler, NP; Tebben, NP °[address], [place], [place] [address] °[phone] °Mon, Tue, Thur, Fri 8:30-5:30, Wed 9:30-5:30, Sat 8:30-12:30 °Babies seen by [facility] providers °Accepting Medicaid °Only accepting infants of first-time parents or siblings of current patients °Hospital discharge coordinator will make follow-up appointment °Jack Amos °[address], [place], [place]  [address] °[phone]   Fax - [phone] °[facility] °[address], [place], [place]  [address] °Phone - [phone]   Fax - [phone] °Shilpa Gosrani °[address], [place], [place]  [address] °[phone] ° °[place] ([address]) °[facility] °Bates, MD; Brassfield, MD; Cooper, Cox, MD; MD; Davis, MD; Dovico, MD; Ettefaugh, MD; Little, MD; Lowe, MD; Keiffer, MD; Melvin, MD; Sumner, MD; Williams, MD °[address], [place], [place] [address] °[phone] °Mon-Fri 8:30-5:00 (extended evenings Mon-Thur as needed), Sat-Sun 10:00-1:00 °Providers come to see babies at [facility] °Accepting Medicaid for families of first-time babies and families with all children in the household age 3 and under. Must register with office prior to making appointment (M-F only). °[facility] °Henson, NP; Knapp, MD; Lalonde, MD; Tysinger, PA °[address] [place], [place] [address] °[phone] °Mon-Fri 8:00-5:00 °Babies seen by providers at [facility] °Does NOT accept Medicaid/Commercial Insurance Only °[facility] ([facility]  °Artis, MD; Barnes, MD; Bratton, MD; Coccaro, MD; Lockett Gardner, MD; Kramer, MD; Marshall, MD; Netherton, MD; Poleto, MD; Skinner, MD °[address] [place], [place] [address] °[phone] °Mon-Fri 8:30-5:30, Sat (Oct.-Mar.) 9:00-1:00 °Babies seen by providers at [facility] °Accepting Medicaid ° °[place] ([address]) °[facility] °Reid, MD; Warner, MD °[address], [place], [place] [address] °[phone] °Mon-Fri 8:30-5:00, Sat 8:30-12:00 °Providers come to see babies at [facility] °Does NOT accept Medicaid °[facility] °Becker, PA; Hagler, MD; Scifres, PA; Sun, MD; Swayne, MD °[address], [place], [place] [address] °[phone] °Mon-Fri 8:00-5:00 °Babies seen by providers at [facility] °Does NOT accept Medicaid °Only accepting babies of parents who  are patients °Please call early in hospitalization for appointment (limited availability) ° Pediatricians °Clark, MD; Frye, MD; Kelleher, MD; Mack, NP; Miller, MD; O'Keller, MD; Patterson, NP; Pudlo, MD; Puzio, MD; Pennel, MD; Tucker, MD; Twiselton, MD °[address], [place], [place] [address] °[phone] °Mon-Fri 8:00-5:00, Sat 9:00-12:00 °Providers come to see babies at [facility] °Does NOT accept Medicaid ° °[place] ([address]) °[facility] °Limited providers accepting new patients: Brake, NP; Wharton, PA °[address], [place], [place] [address] °[phone] °Mon-Fri 8:00-5:00 °Babies seen by providers at [facility] °Does NOT accept Medicaid °Only accepting babies of parents who are patients °Please call early in hospitalization for appointment (limited availability) °[facility] °Gay, MD; Quinlan, MD °[address] [place], [place] [address] °[phone] (press 1 to schedule appointment) °Mon-Fri 8:00-5:00 °Providers come to see babies at [facility] °Does NOT accept Medicaid °[facility] °Mazer, MD °[address] [place], [place] [address] °[phone] °Mon-Fri 8:30-5:00 (lunch 12:30-1:00), extended hours by appointment only Wed 5:00-6:30 °Babies seen by [facility] providers °Accepting Medicaid °[facility] °Banks, MD; Jordan, MD; Koberlein, MD °[address], [place], [place] [address] °[phone] °Mon-Fri 8:00-5:00 °Babies seen by [facility] providers °Does NOT accept Medicaid °[facility] °Parker, MD; Hunter, MD; Wallace, DO °[address] [place], [place] [address] °[phone] °Mon-Fri 8:00-5:00 °Babies seen by [facility] providers °Does NOT accept Medicaid °[facility] °Brandon, PA; Brecken, PA; Christy, NP; Dees, MD; DeClaire, MD; DeWeese, MD; Hansen, NP; Mills, NP; Parrish, NP; Smoot, NP; Summer, MD; Vapne,  MD °[address] [place], [place] [address] °[phone] °Mon-Fri 8:30-5:00, Sat 10:00-1:00 °Providers come to see babies at [facility] °Does NOT accept Medicaid °Free prenatal information session Tuesdays at 4:45pm °[facility] °Bouska, MD; Gordon, PA; Jeffery, PA; Weber, PA °[address] [place] [address] °[phone] °Mon-Fri 7:30-5:30 °Babies seen by [facility] providers °[facility] °[address], [place], [place]  [address] °[phone]   Fax - [phone] ° °[place] ([address]) °[facility] °Reese, MD °[address] [place], [place] [address] °[phone] °Mon-Thur 8:00-6:00 °Providers come to see babies at [facility] °Accepting Medicaid °[facility] °Anderson, NP; Badger, MD; Beal, PA; Spencer, PA °[address] [place], [place] [address] °[phone] °Mon-Thur 7:30-7:30, Fri 7:30-4:30 °Babies seen by [facility] providers °Accepting Medicaid °[facility] °Agbuya, MD; Klett, NP; Romgoolam, MD °[address], [place], [place] [address] °[phone] °Mon-Fri 8:30-5:00, Sat 8:30-12:00 °Providers come to see babies at [facility] °Accepting Medicaid °Must have “Meet & Greet” appointment at office prior to delivery °[facility] ([facility]) °McCord, MD; Wallace, MD; Wood, MD °[address], [place], [place] [address] °[phone] °Mon-Wed 8:00-6:00, Thur-Fri 8:00-5:00, Sat 9:00-12:00 °Providers come to see babies at [facility] °Does NOT accept Medicaid °Only accepting siblings of current patients °[facility]  °[address], [place], [place]  [address] °[phone]   Fax - [phone] °[facility] °[address], [place], [place]  [address] °[phone]   Fax -  [phone] ° °[place] ([address]) °[facility] °Cirigliano, DO; Matthews, DO °[address] [place], [place] [address] °[phone] °Mon-Fri 7:00-5:00 °Babies seen by [facility] providers °Does NOT accept Medicaid °[facility] °Briscoe, MD; Howley, PA; Moreira, PA °[address], [place], [place] [address] °[phone] °Mon-Fri 8:00-5:00 °Babies seen by [facility] providers °Accepting Medicaid °[facility] °Boyd, MD; Church, PA; Jones, NP; Osborn, PA °[address], [place], [place] [address] °[phone] °Mon-Fri 8:00-5:00 °Babies seen by providers at [facility] °Accepting Medicaid ° °[place] ([address]) °[facility] [place] °Wendling, DO °[address] [place], [place] [address] °[phone] °Mon-Fri 8:00-5:00 °Babies seen by [facility] providers °Does NOT accept Medicaid °Limited availability, please call early in hospitalization to schedule follow-up °[facility] °Calderon, PA; Cummings, MD; Dillard, MD; Martin, PA; Olson, MD; VanDeven, PA °[address], [place], [place] [address] °[phone] °Mon-Fri 8:30-5:00, Sat 9:00-12:00 °Babies seen by providers at [facility] °Accepting Medicaid °Please register online then schedule online or call office °[email or web address] °[facility] ([facility]) °Hunter, NP; Kumar, MD; Martin Rogers, PA °[address], [place], [place] [address] °[phone] °Mon-Fri 8:00-5:00 °Babies seen by providers at [facility] °Accepting Medicaid °[facility] ([facility]) °Pleasant Plains, MD; Kristi Fleenor, NP; West, MD °[address], [place], [place] [address] °[phone] °Mon-Fri 8:00-5:30, Sat&Sun by appointment (phones open at  8:30) °Babies seen by [facility] providers °Accepting Medicaid °Must be a first-time baby or sibling of current patient °[facility] [place]  °[address], [place], [place]  [address] °[phone]   Fax - [phone] ° °[place] ([address]) °[facility] °Brown, PA; Cowen, PA; Rice, MD; Helton, PA; Spry, MD °[address] [place], [place] [address] °[phone] °Mon-Thur 8:00-7:00, Fri 8:00-5:00, Sat 8:00-12:00, Sun 9:00-12:00 °Babies seen by [facility] providers °Accepting Medicaid °[facility] °Coe-Goins, MD; Marshall, MD; Pierre-Louis, MD °[address], [place], [place] [address] °[phone] °Mon-Thur 8:00-5:00 °Babies seen by providers at [facility] °Accepting Medicaid °[facility] °Bratton, MD; Coe-Goins, MD; Hayes, MD; Lewis, MD; List, MD; Lott, MD; Marshall, MD; [place], MD; O'Neal, MD; Pierre-Louis, MD; Pitonzo, MD; Scholer, MD; Spangle, MD °[address] [place], [place] [address] °[phone] °Mon-Fri 8:00-5:30, Sat (Oct.-Mar.) 9:00-1:00 °Babies seen by providers at [facility] °Accepting Medicaid °Must fill out new patient packet, available online at [email or web address] °[facility] ([facility]) °Friddle, NP; Harris, NP; Kelly, NP; Logan, MD; Melvin, PA; Poth, MD; Ramadoss, MD; Stanton, NP °[address], [place], [place] [address] °[phone] °Mon-Thur 8:00-5:30, Fri 8:00-5:00 °Babies seen by providers at [facility] °Accepting Medicaid ° °[place] ([address]) °[facility] °Dixon, PA; [place], MD; Pickard, MD; Tapia, PA °[address], [place], [place] [address] °[phone] °Mon-Fri 8:00-5:00 °Babies seen by providers at [facility] °Accepting Medicaid  ° °[place] ([address]) °[facility] [place] °Masneri, DO; Meyers, MD; [place], PA °[address], [place], [place] [address] °[phone] °Mon-Fri 8:00-5:00 °Babies seen by providers at [facility] °Does NOT accept Medicaid °Limited appointment availability, please call early in hospitalization ° °[facility] [place] °Kunedd, DO; McGowen, MD °[address], [place], [place] [address] °[phone] °Mon-Fri 8:00-5:00 °Babies seen by [facility] providers °Does NOT accept Medicaid °[facility] [place] °Cameron, MD; MacDonald, MD; Michaels, PA; Nayak, MD °[address] [place] [address], [place], [place] [address] °[phone] °Mon-Fri 8:00-5:00 °After hours clinic ([address] [place],  [address]) [phone] Mon-Fri 5:00-8:00, Sat 12:00-6:00, Sun 10:00-4:00 °Babies seen by [facility] providers °Accepting Medicaid °[facility] [place] °[address], [place], [place]  [address] °[phone]   Fax - [phone] ° °[place] ([address]) °[facility] [place] [facility] °Andy, MD °[address], [place], [place] [address] °[phone] °Mon-Fri 8:00-5:00 °Babies seen by [facility] providers °Does NOT accept Medicaid °[facility] [place] ([facility] [place]) °Eksir, MD °[address], [place], [place] [address] °[phone] °Mon-Thur 8:00-7:00, Fri 8:00-5:00, Sat 8:00-12:00 °Babies seen by providers at [facility] °Accepting Medicaid - but does not have vaccinations in office (must be received elsewhere) °Limited availability, please call early in hospitalization ° °[place] ([address]) °[facility]  °Charlene Flemming, MD °[address], [place] [address] °[phone]  Fax [phone] ° °[place] °[facility]  °[facility] Center  °Kimberly Newton, MD, Annamarie Streilein, PA, Carla Hampton, PA °[address] °[place], [place]  [address] °[phone] °[facility]  °[address], [place], [place] [address] °[phone] °[address], [place], [place] [address] °[phone] (West Office)  °[facility] °[address], [place], [place] [address] °[phone] °[facility] °[address], [place], [place] [address] °[phone] °[facility] °[address], [place], [place] [address] °[phone] °[facility] °[address], [place], [place] [address] °[phone] °[facility] °[address], [place], [place] [address] °[phone] °[facility] °[address], [place], [place] [address] °[phone] °[facility]  °[address], [place], [place] [address] °[phone] °Dr. Robert W. Little °[address] [place] [address], [place], [place] [address] °[phone] °[facility] °[address], [place], [place] [address] °[phone] °[facility] °[address], ,  [address] °[phone] ° °

## 2021-03-17 NOTE — Progress Notes (Signed)
? ?  PRENATAL VISIT NOTE ? ?Subjective:  ?Alison Morales is a 21 y.o. G3P0020 at [redacted]w[redacted]d being seen today for ongoing prenatal care.  She is currently monitored for the following issues for this high-risk pregnancy and has Supervision of high risk pregnancy, antepartum; Obesity in pregnancy; BMI 30s; Late prenatal care affecting pregnancy in second trimester; Marginal insertion of umbilical cord affecting management of mother; and Gestational diabetes mellitus (GDM) affecting pregnancy on their problem list. ? ?Patient reports no complaints.  Contractions: Irritability. Vag. Bleeding: None.  Movement: Present. Denies leaking of fluid.  ? ?The following portions of the patient's history were reviewed and updated as appropriate: allergies, current medications, past family history, past medical history, past social history, past surgical history and problem list.  ? ?Objective:  ? ?Vitals:  ? 03/17/21 1419 03/17/21 1429  ?BP: (!) 143/90 130/86  ?Pulse: (!) 116 (!) 111  ?Weight: 253 lb 14.4 oz (115.2 kg)   ? ? ?Fetal Status: Fetal Heart Rate (bpm): 141   Movement: Present    ? ?General:  Alert, oriented and cooperative. Patient is in no acute distress.  ?Skin: Skin is warm and dry. No rash noted.   ?Cardiovascular: Normal heart rate noted  ?Respiratory: Normal respiratory effort, no problems with respiration noted  ?Abdomen: Soft, gravid, appropriate for gestational age.  Pain/Pressure: Present     ?Pelvic: Cervical exam deferred        ?Extremities: Normal range of motion.  Edema: None  ?Mental Status: Normal mood and affect. Normal behavior. Normal judgment and thought content.  ? ?Assessment and Plan:  ?Pregnancy: G3P0020 at [redacted]w[redacted]d ?1. Supervision of high risk pregnancy, antepartum ?FHT and FH normal ? ?2. Gestational diabetes mellitus (GDM) affecting pregnancy ?CBGs controlled on metformin. ?NO side effects. ?BPP weekly ?Delivery 39 weeks ? ?3. Marginal insertion of umbilical cord affecting management of  mother ?Growth normal ? ?4. Obesity in pregnancy ? ?Preterm labor symptoms and general obstetric precautions including but not limited to vaginal bleeding, contractions, leaking of fluid and fetal movement were reviewed in detail with the patient. ?Please refer to After Visit Summary for other counseling recommendations.  ? ?No follow-ups on file. ? ?Future Appointments  ?Date Time Provider [place]  ?03/17/2021  3:15 PM [facility] NURSE [facility]  ?03/17/2021  3:30 PM [facility] US3 [facility] [place]  ? ? ?Truett Mainland, DO ? ?

## 2021-03-18 ENCOUNTER — Other Ambulatory Visit: Payer: Self-pay | Admitting: *Deleted

## 2021-03-18 DIAGNOSIS — O43193 Other malformation of placenta, third trimester: Secondary | ICD-10-CM

## 2021-03-18 DIAGNOSIS — O0933 Supervision of pregnancy with insufficient antenatal care, third trimester: Secondary | ICD-10-CM

## 2021-03-18 DIAGNOSIS — O24419 Gestational diabetes mellitus in pregnancy, unspecified control: Secondary | ICD-10-CM

## 2021-03-22 ENCOUNTER — Encounter: Payer: Self-pay | Admitting: Obstetrics and Gynecology

## 2021-03-23 ENCOUNTER — Other Ambulatory Visit: Payer: Self-pay | Admitting: *Deleted

## 2021-03-23 DIAGNOSIS — O0933 Supervision of pregnancy with insufficient antenatal care, third trimester: Secondary | ICD-10-CM

## 2021-03-23 DIAGNOSIS — O43199 Other malformation of placenta, unspecified trimester: Secondary | ICD-10-CM

## 2021-03-23 DIAGNOSIS — O24415 Gestational diabetes mellitus in pregnancy, controlled by oral hypoglycemic drugs: Secondary | ICD-10-CM

## 2021-03-24 ENCOUNTER — Ambulatory Visit (INDEPENDENT_AMBULATORY_CARE_PROVIDER_SITE_OTHER): Payer: Medicaid Other

## 2021-03-24 ENCOUNTER — Other Ambulatory Visit: Payer: Self-pay

## 2021-03-24 ENCOUNTER — Ambulatory Visit: Payer: Medicaid Other | Admitting: *Deleted

## 2021-03-24 VITALS — BP 122/80 | HR 102

## 2021-03-24 DIAGNOSIS — O24415 Gestational diabetes mellitus in pregnancy, controlled by oral hypoglycemic drugs: Secondary | ICD-10-CM | POA: Diagnosis not present

## 2021-03-24 NOTE — Progress Notes (Signed)

## 2021-04-01 ENCOUNTER — Ambulatory Visit (INDEPENDENT_AMBULATORY_CARE_PROVIDER_SITE_OTHER): Payer: Medicaid Other | Admitting: Family Medicine

## 2021-04-01 ENCOUNTER — Ambulatory Visit (INDEPENDENT_AMBULATORY_CARE_PROVIDER_SITE_OTHER): Payer: Medicaid Other

## 2021-04-01 ENCOUNTER — Ambulatory Visit: Payer: Medicaid Other | Admitting: *Deleted

## 2021-04-01 ENCOUNTER — Other Ambulatory Visit: Payer: Self-pay

## 2021-04-01 VITALS — BP 139/89 | HR 82 | Wt 263.6 lb

## 2021-04-01 DIAGNOSIS — O0990 Supervision of high risk pregnancy, unspecified, unspecified trimester: Secondary | ICD-10-CM

## 2021-04-01 DIAGNOSIS — O0932 Supervision of pregnancy with insufficient antenatal care, second trimester: Secondary | ICD-10-CM

## 2021-04-01 DIAGNOSIS — O43199 Other malformation of placenta, unspecified trimester: Secondary | ICD-10-CM

## 2021-04-01 DIAGNOSIS — O24415 Gestational diabetes mellitus in pregnancy, controlled by oral hypoglycemic drugs: Secondary | ICD-10-CM

## 2021-04-01 DIAGNOSIS — O24419 Gestational diabetes mellitus in pregnancy, unspecified control: Secondary | ICD-10-CM

## 2021-04-01 NOTE — Progress Notes (Signed)
? ?  PRENATAL VISIT NOTE ? ?Subjective:  ?Alison Morales is a 21 y.o. G3P0020 at [redacted]w[redacted]d being seen today for ongoing prenatal care.  She is currently monitored for the following issues for this high-risk pregnancy and has Supervision of high risk pregnancy, antepartum; Obesity in pregnancy; BMI 30s; Late prenatal care affecting pregnancy in second trimester; Marginal insertion of umbilical cord affecting management of mother; and Gestational diabetes mellitus (GDM) affecting pregnancy on their problem list. ? ?Patient reports no complaints.  Contractions: Irritability. Vag. Bleeding: None.  Movement: Present. Denies leaking of fluid.  ? ?The following portions of the patient's history were reviewed and updated as appropriate: allergies, current medications, past family history, past medical history, past social history, past surgical history and problem list.  ? ?Objective:  ? ?Vitals:  ? 04/01/21 1003  ?BP: 139/89  ?Pulse: 82  ?Weight: 263 lb 9.6 oz (119.6 kg)  ? ? ?Fetal Status: Fetal Heart Rate (bpm): 160 Fundal Height: 35 cm Movement: Present    ? ?General:  Alert, oriented and cooperative. Patient is in no acute distress.  ?Skin: Skin is warm and dry. No rash noted.   ?Cardiovascular: Normal heart rate noted  ?Respiratory: Normal respiratory effort, no problems with respiration noted  ?Abdomen: Soft, gravid, appropriate for gestational age.  Pain/Pressure: Present     ?Pelvic: Cervical exam deferred        ?Extremities: Normal range of motion.  Edema: None  ?Mental Status: Normal mood and affect. Normal behavior. Normal judgment and thought content.  ?NST:  Baseline: 140 bpm, Variability: Good {> 6 bpm), Accelerations: Reactive, and Decelerations: Absent ? ?Assessment and Plan:  ?Pregnancy: G3P0020 at [redacted]w[redacted]d ?1. Gestational diabetes mellitus (GDM) affecting pregnancy ?Sugar log in phone shows largest spike at 140s post evening meal ?Discussed diet at length and she has some room to improve ?Can add Metformin  in am if persistently elevated ? ?2. Supervision of high risk pregnancy, antepartum ? ? ?3. Late prenatal care affecting pregnancy in second trimester ? ? ?4. Marginal insertion of umbilical cord affecting management of mother ?Last growth was 19% ? ?Preterm labor symptoms and general obstetric precautions including but not limited to vaginal bleeding, contractions, leaking of fluid and fetal movement were reviewed in detail with the patient. ?Please refer to After Visit Summary for other counseling recommendations.  ? ?Return in 2 weeks (on 04/15/2021). ? ?Future Appointments  ?Date Time Provider [place]  ?04/07/2021  8:15 AM Clarnce Flock, MD [facility]  ?04/07/2021 10:15 AM [facility] NST [facility]  ?04/14/2021  8:15 AM [facility] NURSE [facility]  ?04/14/2021  8:30 AM [facility] US3 [facility] [place]  ?04/14/2021 10:15 AM Radene Gunning, MD [facility]  ?04/14/2021 11:15 AM [facility] NST [facility]  ? ? ?Donnamae Jude, MD ? ?

## 2021-04-07 ENCOUNTER — Ambulatory Visit (INDEPENDENT_AMBULATORY_CARE_PROVIDER_SITE_OTHER): Payer: Medicaid Other

## 2021-04-07 ENCOUNTER — Ambulatory Visit (INDEPENDENT_AMBULATORY_CARE_PROVIDER_SITE_OTHER): Payer: Medicaid Other | Admitting: Family Medicine

## 2021-04-07 ENCOUNTER — Other Ambulatory Visit: Payer: Self-pay

## 2021-04-07 ENCOUNTER — Ambulatory Visit: Payer: Medicaid Other | Admitting: *Deleted

## 2021-04-07 ENCOUNTER — Encounter: Payer: Self-pay | Admitting: Family Medicine

## 2021-04-07 VITALS — BP 136/84 | HR 112 | Wt 268.0 lb

## 2021-04-07 DIAGNOSIS — O0990 Supervision of high risk pregnancy, unspecified, unspecified trimester: Secondary | ICD-10-CM

## 2021-04-07 DIAGNOSIS — O24415 Gestational diabetes mellitus in pregnancy, controlled by oral hypoglycemic drugs: Secondary | ICD-10-CM

## 2021-04-07 DIAGNOSIS — O99210 Obesity complicating pregnancy, unspecified trimester: Secondary | ICD-10-CM | POA: Diagnosis not present

## 2021-04-07 DIAGNOSIS — O24419 Gestational diabetes mellitus in pregnancy, unspecified control: Secondary | ICD-10-CM

## 2021-04-07 DIAGNOSIS — O10919 Unspecified pre-existing hypertension complicating pregnancy, unspecified trimester: Secondary | ICD-10-CM | POA: Insufficient documentation

## 2021-04-07 DIAGNOSIS — O43199 Other malformation of placenta, unspecified trimester: Secondary | ICD-10-CM

## 2021-04-07 NOTE — Progress Notes (Signed)
? ?  Subjective:  ?Alison Morales is a 21 y.o. G3P0020 at [redacted]w[redacted]d being seen today for ongoing prenatal care.  She is currently monitored for the following issues for this high-risk pregnancy and has Supervision of high risk pregnancy, antepartum; Obesity in pregnancy; BMI 30s; Late prenatal care affecting pregnancy in second trimester; Marginal insertion of umbilical cord affecting management of mother; Gestational diabetes mellitus (GDM) affecting pregnancy; and Chronic hypertension affecting pregnancy on their problem list. ? ?Patient reports no complaints.  Contractions: Not present. Vag. Bleeding: None.  Movement: Present. Denies leaking of fluid.  ? ?The following portions of the patient's history were reviewed and updated as appropriate: allergies, current medications, past family history, past medical history, past social history, past surgical history and problem list. Problem list updated. ? ?Objective:  ? ?Vitals:  ? 04/07/21 0832  ?BP: 136/84  ?Pulse: (!) 112  ?Weight: 268 lb (121.6 kg)  ? ? ?Fetal Status: Fetal Heart Rate (bpm): 136   Movement: Present    ? ?General:  Alert, oriented and cooperative. Patient is in no acute distress.  ?Skin: Skin is warm and dry. No rash noted.   ?Cardiovascular: Normal heart rate noted  ?Respiratory: Normal respiratory effort, no problems with respiration noted  ?Abdomen: Soft, gravid, appropriate for gestational age. Pain/Pressure: Absent     ?Pelvic: Vag. Bleeding: None     ?Cervical exam deferred        ?Extremities: Normal range of motion.  Edema: None  ?Mental Status: Normal mood and affect. Normal behavior. Normal judgment and thought content.  ? ?Urinalysis:     ? ?Assessment and Plan:  ?Pregnancy: G3P0020 at [redacted]w[redacted]d ? ?1. Supervision of high risk pregnancy, antepartum ?BP and FHR normal ?Swabs next visit ? ?2. Gestational diabetes mellitus (GDM) affecting pregnancy ?On metformin 500mg  qHS ?Sugars are well controlled overall, admits to some dietary indiscretion,  counseled ?Last growth [place] 03/17/2021, EFW 19%, AFI 14 ?Getting weekly BPP, have been normal to date ? ?3. Marginal insertion of umbilical cord affecting management of mother ?Following w MFM for growth scans ?Normal EFW to date ? ?4. Obesity in pregnancy ? ? ?5. Chronic hypertension ?On initial review of chart patient has had multiple elevated blood pressures at >20 weeks during this pregnancy ?After reviewing pre-pregnancy visits her overall picture is most c/w mild cHTN and not gHTN ?Assuming BP's and sugars remain well controlled will still plan for 39 wk IOL ? ? ?Preterm labor symptoms and general obstetric precautions including but not limited to vaginal bleeding, contractions, leaking of fluid and fetal movement were reviewed in detail with the patient. ?Please refer to After Visit Summary for other counseling recommendations.  ?Return in 1 week (on 04/14/2021) for [facility], ob visit. ? ? ?Clarnce Flock, MD ? ?

## 2021-04-07 NOTE — Patient Instructions (Signed)

## 2021-04-07 NOTE — Progress Notes (Signed)

## 2021-04-11 NOTE — Progress Notes (Signed)
? ?  PRENATAL VISIT NOTE ? ?Subjective:  ?Alison Morales is a 21 y.o. G3P0020 at [redacted]w[redacted]d being seen today for ongoing prenatal care.  She is currently monitored for the following issues for this high-risk pregnancy and has Supervision of high risk pregnancy, antepartum; Obesity in pregnancy; BMI 30s; Late prenatal care affecting pregnancy in second trimester; Marginal insertion of umbilical cord affecting management of mother; Gestational diabetes mellitus (GDM) affecting pregnancy; and Chronic hypertension affecting pregnancy on their problem list. ? ?Patient reports no complaints.  Contractions: Irritability. Vag. Bleeding: None.  Movement: Present. Denies leaking of fluid.  ? ?The following portions of the patient's history were reviewed and updated as appropriate: allergies, current medications, past family history, past medical history, past social history, past surgical history and problem list.  ? ?Objective:  ? ?Vitals:  ? 04/14/21 0941  ?BP: 129/86  ?Pulse: 99  ?Weight: 268 lb (121.6 kg)  ? ? ?Fetal Status: Fetal Heart Rate (bpm): 141   Movement: Present    ? ?General:  Alert, oriented and cooperative. Patient is in no acute distress.  ?Skin: Skin is warm and dry. No rash noted.   ?Cardiovascular: Normal heart rate noted  ?Respiratory: Normal respiratory effort, no problems with respiration noted  ?Abdomen: Soft, gravid, appropriate for gestational age.  Pain/Pressure: Present     ?Pelvic: Cervical exam deferred        ?Extremities: Normal range of motion.  Edema: Trace  ?Mental Status: Normal mood and affect. Normal behavior. Normal judgment and thought content.  ? ?Assessment and Plan:  ?Pregnancy: G3P0020 at [redacted]w[redacted]d ?1. Chronic hypertension affecting pregnancy ?no meds ? ?2. Gestational diabetes mellitus (GDM) affecting pregnancy ?On metformin 500mg  qHS ?Sugars are well controlled overall - reviewed log on her phone - fastings mostly below 95. All meals were normal.  ?Last growth [place] 03/17/2021, EFW  30%ile, AFI 11 ?Getting weekly BPP, have been normal to date ?Discussed IOL at 39w - pt very agreeable.  ? ?3. Supervision of high risk pregnancy, antepartum ?GBS/GC/CT done  ? ?4. Obesity in pregnancy ? ?5. Late prenatal care affecting pregnancy in second trimester ? ?6. Marginal insertion of umbilical cord affecting management of mother ?Normal EFW ? ?Preterm labor symptoms and general obstetric precautions including but not limited to vaginal bleeding, contractions, leaking of fluid and fetal movement were reviewed in detail with the patient. ?Please refer to After Visit Summary for other counseling recommendations.  ? ?No follow-ups on file. ? ?Future Appointments  ?Date Time Provider [place]  ?04/21/2021  1:15 PM [facility] NST [facility]  ?04/21/2021  3:35 PM Dione Plover, Annice Needy, MD [facility]  ?04/28/2021  2:55 PM Dione Plover, Annice Needy, MD [facility]  ?04/28/2021  3:15 PM [facility] NST [facility]  ?05/05/2021  1:55 PM Griffin Basil, MD [facility]  ?05/05/2021  2:15 PM [facility] NST [facility]  ? ? ?Radene Gunning, MD ?

## 2021-04-14 ENCOUNTER — Other Ambulatory Visit: Payer: Medicaid Other

## 2021-04-14 ENCOUNTER — Ambulatory Visit (INDEPENDENT_AMBULATORY_CARE_PROVIDER_SITE_OTHER): Payer: Medicaid Other | Admitting: Obstetrics and Gynecology

## 2021-04-14 ENCOUNTER — Encounter: Payer: Self-pay | Admitting: *Deleted

## 2021-04-14 ENCOUNTER — Ambulatory Visit: Payer: Medicaid Other | Attending: Maternal & Fetal Medicine

## 2021-04-14 ENCOUNTER — Encounter: Payer: Self-pay | Admitting: Obstetrics and Gynecology

## 2021-04-14 ENCOUNTER — Ambulatory Visit: Payer: Medicaid Other | Admitting: *Deleted

## 2021-04-14 ENCOUNTER — Other Ambulatory Visit (HOSPITAL_COMMUNITY)
Admission: RE | Admit: 2021-04-14 | Discharge: 2021-04-14 | Disposition: A | Discharge status: Home / Self Care | Payer: Medicaid Other | Source: Ambulatory Visit | Attending: Obstetrics and Gynecology | Admitting: Obstetrics and Gynecology

## 2021-04-14 VITALS — BP 118/80 | HR 112

## 2021-04-14 VITALS — BP 129/86 | HR 99 | Wt 268.0 lb

## 2021-04-14 DIAGNOSIS — Z3A36 36 weeks gestation of pregnancy: Secondary | ICD-10-CM

## 2021-04-14 DIAGNOSIS — O43199 Other malformation of placenta, unspecified trimester: Secondary | ICD-10-CM | POA: Insufficient documentation

## 2021-04-14 DIAGNOSIS — O0932 Supervision of pregnancy with insufficient antenatal care, second trimester: Secondary | ICD-10-CM

## 2021-04-14 DIAGNOSIS — O24415 Gestational diabetes mellitus in pregnancy, controlled by oral hypoglycemic drugs: Secondary | ICD-10-CM | POA: Diagnosis not present

## 2021-04-14 DIAGNOSIS — O0933 Supervision of pregnancy with insufficient antenatal care, third trimester: Secondary | ICD-10-CM | POA: Insufficient documentation

## 2021-04-14 DIAGNOSIS — E669 Obesity, unspecified: Secondary | ICD-10-CM

## 2021-04-14 DIAGNOSIS — O10919 Unspecified pre-existing hypertension complicating pregnancy, unspecified trimester: Secondary | ICD-10-CM

## 2021-04-14 DIAGNOSIS — O0990 Supervision of high risk pregnancy, unspecified, unspecified trimester: Secondary | ICD-10-CM

## 2021-04-14 DIAGNOSIS — O43193 Other malformation of placenta, third trimester: Secondary | ICD-10-CM | POA: Insufficient documentation

## 2021-04-14 DIAGNOSIS — O24419 Gestational diabetes mellitus in pregnancy, unspecified control: Secondary | ICD-10-CM | POA: Diagnosis present

## 2021-04-14 DIAGNOSIS — O99210 Obesity complicating pregnancy, unspecified trimester: Secondary | ICD-10-CM

## 2021-04-14 DIAGNOSIS — O99213 Obesity complicating pregnancy, third trimester: Secondary | ICD-10-CM

## 2021-04-15 LAB — CERVICOVAGINAL ANCILLARY ONLY
Chlamydia: NEGATIVE
Comment: NEGATIVE
Comment: NORMAL
Neisseria Gonorrhea: NEGATIVE

## 2021-04-18 LAB — CULTURE, BETA STREP (GROUP B ONLY): Strep Gp B Culture: NEGATIVE

## 2021-04-21 ENCOUNTER — Ambulatory Visit (INDEPENDENT_AMBULATORY_CARE_PROVIDER_SITE_OTHER): Payer: Medicaid Other | Admitting: Family Medicine

## 2021-04-21 ENCOUNTER — Ambulatory Visit (INDEPENDENT_AMBULATORY_CARE_PROVIDER_SITE_OTHER): Payer: Medicaid Other

## 2021-04-21 ENCOUNTER — Encounter: Payer: Self-pay | Admitting: Family Medicine

## 2021-04-21 ENCOUNTER — Ambulatory Visit: Payer: Medicaid Other | Admitting: *Deleted

## 2021-04-21 VITALS — BP 115/73 | HR 102 | Wt 270.6 lb

## 2021-04-21 DIAGNOSIS — Z3A37 37 weeks gestation of pregnancy: Secondary | ICD-10-CM | POA: Diagnosis not present

## 2021-04-21 DIAGNOSIS — O10919 Unspecified pre-existing hypertension complicating pregnancy, unspecified trimester: Secondary | ICD-10-CM

## 2021-04-21 DIAGNOSIS — O24419 Gestational diabetes mellitus in pregnancy, unspecified control: Secondary | ICD-10-CM

## 2021-04-21 DIAGNOSIS — O99210 Obesity complicating pregnancy, unspecified trimester: Secondary | ICD-10-CM

## 2021-04-21 DIAGNOSIS — O43199 Other malformation of placenta, unspecified trimester: Secondary | ICD-10-CM

## 2021-04-21 DIAGNOSIS — O24415 Gestational diabetes mellitus in pregnancy, controlled by oral hypoglycemic drugs: Secondary | ICD-10-CM

## 2021-04-21 DIAGNOSIS — O0990 Supervision of high risk pregnancy, unspecified, unspecified trimester: Secondary | ICD-10-CM

## 2021-04-21 NOTE — Progress Notes (Signed)
? ?  Subjective:  ?Alison Morales is a 21 y.o. G3P0020 at [redacted]w[redacted]d being seen today for ongoing prenatal care.  She is currently monitored for the following issues for this high-risk pregnancy and has Supervision of high risk pregnancy, antepartum; Obesity in pregnancy; BMI 30s; Late prenatal care affecting pregnancy in second trimester; Marginal insertion of umbilical cord affecting management of mother; Gestational diabetes mellitus (GDM) affecting pregnancy; and Chronic hypertension affecting pregnancy on their problem list. ? ?Patient reports no complaints.  Contractions: Irregular. Vag. Bleeding: None.  Movement: Present. Denies leaking of fluid.  ? ?The following portions of the patient's history were reviewed and updated as appropriate: allergies, current medications, past family history, past medical history, past social history, past surgical history and problem list. Problem list updated. ? ?Objective:  ? ?Vitals:  ? 04/21/21 1345  ?BP: 115/73  ?Pulse: (!) 102  ?Weight: 270 lb 9.6 oz (122.7 kg)  ? ? ?Fetal Status: Fetal Heart Rate (bpm): NST   Movement: Present    ? ?General:  Alert, oriented and cooperative. Patient is in no acute distress.  ?Skin: Skin is warm and dry. No rash noted.   ?Cardiovascular: Normal heart rate noted  ?Respiratory: Normal respiratory effort, no problems with respiration noted  ?Abdomen: Soft, gravid, appropriate for gestational age. Pain/Pressure: Present     ?Pelvic: Vag. Bleeding: None     ?Cervical exam deferred        ?Extremities: Normal range of motion.     ?Mental Status: Normal mood and affect. Normal behavior. Normal judgment and thought content.  ? ?Urinalysis:     ? ?Assessment and Plan:  ?Pregnancy: G3P0020 at [redacted]w[redacted]d ? ?1. Supervision of high risk pregnancy, antepartum ?BP and FHR normal ? ?2. Chronic hypertension affecting pregnancy ?Normotensive, no meds ? ?3. Obesity in pregnancy ? ? ?4. Gestational diabetes mellitus (GDM) in third trimester controlled on oral  hypoglycemic drug ?Log reviewed on phone, overall good control ?Compliant with metformin ?Last growth [place] 04/14/21, EFW 30%, AFI 11 ?BPP today 10/10 ?IOL not yet scheduled, form sent and orders placed for 39 week induction ? ?5. Marginal insertion of umbilical cord affecting management of mother ?Following w MFM, normal EFW to date ? ?Term labor symptoms and general obstetric precautions including but not limited to vaginal bleeding, contractions, leaking of fluid and fetal movement were reviewed in detail with the patient. ?Please refer to After Visit Summary for other counseling recommendations.  ?Return in 1 week (on 04/28/2021) for [facility], ob visit, needs MD. ? ? ?Clarnce Flock, MD ? ?

## 2021-04-21 NOTE — Patient Instructions (Signed)

## 2021-04-21 NOTE — Progress Notes (Signed)

## 2021-04-22 ENCOUNTER — Telehealth (HOSPITAL_COMMUNITY): Payer: Self-pay | Admitting: *Deleted

## 2021-04-22 NOTE — Telephone Encounter (Signed)
Preadmission screen  

## 2021-04-26 ENCOUNTER — Telehealth (HOSPITAL_COMMUNITY): Payer: Self-pay | Admitting: *Deleted

## 2021-04-26 ENCOUNTER — Encounter (HOSPITAL_COMMUNITY): Payer: Self-pay | Admitting: *Deleted

## 2021-04-26 ENCOUNTER — Encounter: Payer: Self-pay | Admitting: *Deleted

## 2021-04-26 NOTE — Telephone Encounter (Signed)
Preadmission screen  

## 2021-04-28 ENCOUNTER — Encounter: Payer: Self-pay | Admitting: Family Medicine

## 2021-04-28 ENCOUNTER — Ambulatory Visit (INDEPENDENT_AMBULATORY_CARE_PROVIDER_SITE_OTHER): Payer: Medicaid Other | Admitting: Family Medicine

## 2021-04-28 ENCOUNTER — Other Ambulatory Visit: Payer: Self-pay | Admitting: Advanced Practice Midwife

## 2021-04-28 ENCOUNTER — Encounter: Payer: Medicaid Other | Admitting: Family Medicine

## 2021-04-28 ENCOUNTER — Ambulatory Visit: Payer: Medicaid Other | Admitting: *Deleted

## 2021-04-28 ENCOUNTER — Ambulatory Visit (INDEPENDENT_AMBULATORY_CARE_PROVIDER_SITE_OTHER): Payer: Medicaid Other

## 2021-04-28 VITALS — BP 129/83 | HR 101 | Wt 272.6 lb

## 2021-04-28 DIAGNOSIS — O24415 Gestational diabetes mellitus in pregnancy, controlled by oral hypoglycemic drugs: Secondary | ICD-10-CM

## 2021-04-28 DIAGNOSIS — O24419 Gestational diabetes mellitus in pregnancy, unspecified control: Secondary | ICD-10-CM

## 2021-04-28 DIAGNOSIS — O10919 Unspecified pre-existing hypertension complicating pregnancy, unspecified trimester: Secondary | ICD-10-CM

## 2021-04-28 DIAGNOSIS — O0990 Supervision of high risk pregnancy, unspecified, unspecified trimester: Secondary | ICD-10-CM

## 2021-04-28 DIAGNOSIS — O99210 Obesity complicating pregnancy, unspecified trimester: Secondary | ICD-10-CM

## 2021-04-28 DIAGNOSIS — O43199 Other malformation of placenta, unspecified trimester: Secondary | ICD-10-CM

## 2021-04-28 NOTE — Progress Notes (Signed)
? ?  Subjective:  ?Alison Morales is a 21 y.o. G3P0020 at [redacted]w[redacted]d being seen today for ongoing prenatal care.  She is currently monitored for the following issues for this high-risk pregnancy and has Supervision of high risk pregnancy, antepartum; Obesity in pregnancy; BMI 30s; Late prenatal care affecting pregnancy in second trimester; Marginal insertion of umbilical cord affecting management of mother; Gestational diabetes mellitus (GDM) affecting pregnancy; and Chronic hypertension affecting pregnancy on their problem list. ? ?Patient reports no complaints.  Contractions: Irregular. Vag. Bleeding: None.  Movement: Present. Denies leaking of fluid.  ? ?The following portions of the patient's history were reviewed and updated as appropriate: allergies, current medications, past family history, past medical history, past social history, past surgical history and problem list. Problem list updated. ? ?Objective:  ? ?Vitals:  ? 04/28/21 1539  ?BP: 129/83  ?Pulse: (!) 101  ?Weight: 272 lb 9.6 oz (123.7 kg)  ? ? ?Fetal Status: Fetal Heart Rate (bpm): NST   Movement: Present    ? ?General:  Alert, oriented and cooperative. Patient is in no acute distress.  ?Skin: Skin is warm and dry. No rash noted.   ?Cardiovascular: Normal heart rate noted  ?Respiratory: Normal respiratory effort, no problems with respiration noted  ?Abdomen: Soft, gravid, appropriate for gestational age. Pain/Pressure: Present     ?Pelvic: Vag. Bleeding: None     ?Cervical exam deferred        ?Extremities: Normal range of motion.     ?Mental Status: Normal mood and affect. Normal behavior. Normal judgment and thought content.  ? ?Urinalysis:     ? ?Assessment and Plan:  ?Pregnancy: G3P0020 at [redacted]w[redacted]d ? ?1. Supervision of high risk pregnancy, antepartum ?BP normal, NST reactive ? ? ?2. Gestational diabetes mellitus (GDM) in third trimester controlled on oral hypoglycemic drug ?Well controlled on review of log from phone ?On metformin 500mg  QHS ?Last  growth [place] 04/14/21, EFW 30%, AFI 11 ?IOL scheduled for 39 wks already ? ?3. Chronic hypertension affecting pregnancy ?Normotensive, no meds ? ?4. Obesity in pregnancy ? ? ?5. Gestational diabetes mellitus (GDM) affecting pregnancy ? ? ?6. Marginal insertion of umbilical cord affecting management of mother ?Normal EFW ? ?Term labor symptoms and general obstetric precautions including but not limited to vaginal bleeding, contractions, leaking of fluid and fetal movement were reviewed in detail with the patient. ?Please refer to After Visit Summary for other counseling recommendations.  ?Return in 7 weeks (on 06/16/2021) for PP check. ? ? ?Clarnce Flock, MD ? ?

## 2021-04-28 NOTE — Progress Notes (Signed)
IOL scheduled on 4/24 ?

## 2021-04-28 NOTE — Patient Instructions (Signed)

## 2021-04-29 ENCOUNTER — Encounter: Payer: Self-pay | Admitting: Family Medicine

## 2021-05-03 ENCOUNTER — Inpatient Hospital Stay (HOSPITAL_COMMUNITY): Payer: Medicaid Other | Admitting: Anesthesiology

## 2021-05-03 ENCOUNTER — Inpatient Hospital Stay (HOSPITAL_COMMUNITY)
Admission: AD | Admit: 2021-05-03 | Discharge: 2021-05-07 | DRG: 786 | Disposition: A | Discharge status: Home / Self Care | Payer: Medicaid Other | Attending: Family Medicine | Admitting: Family Medicine

## 2021-05-03 ENCOUNTER — Encounter (HOSPITAL_COMMUNITY): Payer: Self-pay | Admitting: Family Medicine

## 2021-05-03 ENCOUNTER — Inpatient Hospital Stay (HOSPITAL_COMMUNITY): Payer: Medicaid Other

## 2021-05-03 ENCOUNTER — Other Ambulatory Visit: Payer: Self-pay

## 2021-05-03 DIAGNOSIS — O480 Post-term pregnancy: Secondary | ICD-10-CM | POA: Diagnosis present

## 2021-05-03 DIAGNOSIS — O0932 Supervision of pregnancy with insufficient antenatal care, second trimester: Secondary | ICD-10-CM

## 2021-05-03 DIAGNOSIS — O24419 Gestational diabetes mellitus in pregnancy, unspecified control: Secondary | ICD-10-CM | POA: Diagnosis present

## 2021-05-03 DIAGNOSIS — O99214 Obesity complicating childbirth: Secondary | ICD-10-CM | POA: Diagnosis present

## 2021-05-03 DIAGNOSIS — O43123 Velamentous insertion of umbilical cord, third trimester: Secondary | ICD-10-CM | POA: Diagnosis present

## 2021-05-03 DIAGNOSIS — O10919 Unspecified pre-existing hypertension complicating pregnancy, unspecified trimester: Secondary | ICD-10-CM

## 2021-05-03 DIAGNOSIS — O0990 Supervision of high risk pregnancy, unspecified, unspecified trimester: Secondary | ICD-10-CM

## 2021-05-03 DIAGNOSIS — Z98891 History of uterine scar from previous surgery: Principal | ICD-10-CM

## 2021-05-03 DIAGNOSIS — Z3A39 39 weeks gestation of pregnancy: Secondary | ICD-10-CM | POA: Diagnosis not present

## 2021-05-03 DIAGNOSIS — O24425 Gestational diabetes mellitus in childbirth, controlled by oral hypoglycemic drugs: Secondary | ICD-10-CM | POA: Diagnosis present

## 2021-05-03 DIAGNOSIS — Z87891 Personal history of nicotine dependence: Secondary | ICD-10-CM

## 2021-05-03 DIAGNOSIS — O1092 Unspecified pre-existing hypertension complicating childbirth: Secondary | ICD-10-CM | POA: Diagnosis not present

## 2021-05-03 DIAGNOSIS — O1002 Pre-existing essential hypertension complicating childbirth: Secondary | ICD-10-CM | POA: Diagnosis present

## 2021-05-03 DIAGNOSIS — O9081 Anemia of the puerperium: Secondary | ICD-10-CM | POA: Diagnosis not present

## 2021-05-03 DIAGNOSIS — O43199 Other malformation of placenta, unspecified trimester: Secondary | ICD-10-CM | POA: Diagnosis present

## 2021-05-03 DIAGNOSIS — Z8632 Personal history of gestational diabetes: Secondary | ICD-10-CM | POA: Diagnosis present

## 2021-05-03 DIAGNOSIS — O411230 Chorioamnionitis, third trimester, not applicable or unspecified: Secondary | ICD-10-CM | POA: Diagnosis present

## 2021-05-03 DIAGNOSIS — O24415 Gestational diabetes mellitus in pregnancy, controlled by oral hypoglycemic drugs: Secondary | ICD-10-CM

## 2021-05-03 DIAGNOSIS — O99210 Obesity complicating pregnancy, unspecified trimester: Secondary | ICD-10-CM | POA: Diagnosis present

## 2021-05-03 LAB — GLUCOSE, CAPILLARY
Glucose-Capillary: 127 mg/dL — ABNORMAL HIGH (ref 70–99)
Glucose-Capillary: 92 mg/dL (ref 70–99)
Glucose-Capillary: 97 mg/dL (ref 70–99)
Glucose-Capillary: 87 mg/dL (ref 70–99)

## 2021-05-03 LAB — CBC
HCT: 42 % (ref 36.0–46.0)
HCT: 40.3 % (ref 36.0–46.0)
Hemoglobin: 14.4 g/dL (ref 12.0–15.0)
Hemoglobin: 13.4 g/dL (ref 12.0–15.0)
MCH: 29.6 pg (ref 26.0–34.0)
MCH: 28.6 pg (ref 26.0–34.0)
MCHC: 34.3 g/dL (ref 30.0–36.0)
MCHC: 33.3 g/dL (ref 30.0–36.0)
MCV: 86.4 fL (ref 80.0–100.0)
MCV: 85.9 fL (ref 80.0–100.0)
Platelets: 321 10*3/uL (ref 150–400)
Platelets: 270 10*3/uL (ref 150–400)
RBC: 4.86 MIL/uL (ref 3.87–5.11)
RBC: 4.69 MIL/uL (ref 3.87–5.11)
RDW: 13.9 % (ref 11.5–15.5)
RDW: 13.9 % (ref 11.5–15.5)
WBC: 6.8 10*3/uL (ref 4.0–10.5)
WBC: 9.2 10*3/uL (ref 4.0–10.5)
nRBC: 0 % (ref 0.0–0.2)
nRBC: 0 % (ref 0.0–0.2)

## 2021-05-03 LAB — RPR: RPR Ser Ql: NONREACTIVE

## 2021-05-03 LAB — TYPE AND SCREEN
ABO/RH(D): A POS
Antibody Screen: NEGATIVE

## 2021-05-03 LAB — COMPREHENSIVE METABOLIC PANEL
ALT: 15 U/L (ref 0–44)
AST: 34 U/L (ref 15–41)
Albumin: 3 g/dL — ABNORMAL LOW (ref 3.5–5.0)
Alkaline Phosphatase: 137 U/L — ABNORMAL HIGH (ref 38–126)
Anion gap: 9 (ref 5–15)
BUN: 8 mg/dL (ref 6–20)
CO2: 18 mmol/L — ABNORMAL LOW (ref 22–32)
Calcium: 9.2 mg/dL (ref 8.9–10.3)
Chloride: 109 mmol/L (ref 98–111)
Creatinine, Ser: 0.81 mg/dL (ref 0.44–1.00)
GFR, Estimated: >60 mL/min (ref >60)
Glucose, Bld: 113 mg/dL — ABNORMAL HIGH (ref 70–99)
Potassium: 4.6 mmol/L (ref 3.5–5.1)
Sodium: 136 mmol/L (ref 135–145)
Total Bilirubin: 0.5 mg/dL (ref 0.3–1.2)
Total Protein: 6.6 g/dL (ref 6.5–8.1)

## 2021-05-03 MED ORDER — SOD CITRATE-CITRIC ACID 500-334 MG/5ML PO SOLN
30.0000 mL | ORAL | Status: DC | PRN
  Filled 2021-05-03: qty 30

## 2021-05-03 MED ORDER — OXYTOCIN-SODIUM CHLORIDE 30-0.9 UT/500ML-% IV SOLN
1.0000 m[IU]/min | INTRAVENOUS | Status: DC
  Administered 2021-05-03: 2 m[IU]/min via INTRAVENOUS

## 2021-05-03 MED ORDER — LACTATED RINGERS IV SOLN: INTRAVENOUS | Status: DC

## 2021-05-03 MED ORDER — TERBUTALINE SULFATE 1 MG/ML IJ SOLN: 0.2500 mg | Freq: Once | INTRAMUSCULAR | Status: DC | PRN

## 2021-05-03 MED ORDER — LIDOCAINE HCL (PF) 1 % IJ SOLN
INTRAMUSCULAR | Status: DC | PRN
  Administered 2021-05-03: 7 mL via EPIDURAL
  Administered 2021-05-03: 3 mL via EPIDURAL

## 2021-05-03 MED ORDER — OXYTOCIN BOLUS FROM INFUSION
333.0000 mL | Freq: Once | INTRAVENOUS | Status: DC
Start: 2021-05-03 — End: 2021-05-04

## 2021-05-03 MED ORDER — PHENYLEPHRINE 80 MCG/ML (10ML) SYRINGE FOR IV PUSH (FOR BLOOD PRESSURE SUPPORT): 80.0000 ug | PREFILLED_SYRINGE | INTRAVENOUS | Status: DC | PRN

## 2021-05-03 MED ORDER — MISOPROSTOL 50MCG HALF TABLET
50.0000 ug | ORAL_TABLET | ORAL | Status: DC | PRN
  Administered 2021-05-03: 50 ug via BUCCAL
  Filled 2021-05-03: qty 1

## 2021-05-03 MED ORDER — OXYTOCIN-SODIUM CHLORIDE 30-0.9 UT/500ML-% IV SOLN
2.5000 [IU]/h | INTRAVENOUS | Status: DC
  Filled 2021-05-03: qty 500

## 2021-05-03 MED ORDER — EPHEDRINE 5 MG/ML INJ: 10.0000 mg | INTRAVENOUS | Status: DC | PRN

## 2021-05-03 MED ORDER — LEVONORGESTREL 20 MCG/DAY IU IUD: 1.0000 | INTRAUTERINE_SYSTEM | Freq: Once | INTRAUTERINE | Status: DC

## 2021-05-03 MED ORDER — LACTATED RINGERS IV SOLN
500.0000 mL | INTRAVENOUS | Status: DC | PRN
  Administered 2021-05-03: 500 mL via INTRAVENOUS

## 2021-05-03 MED ORDER — LIDOCAINE HCL (PF) 1 % IJ SOLN: 30.0000 mL | INTRAMUSCULAR | Status: DC | PRN

## 2021-05-03 MED ORDER — LACTATED RINGERS IV SOLN
500.0000 mL | Freq: Once | INTRAVENOUS | Status: DC
Start: 2021-05-03 — End: 2021-05-04

## 2021-05-03 MED ORDER — FENTANYL-BUPIVACAINE-NACL 0.5-0.125-0.9 MG/250ML-% EP SOLN
12.0000 mL/h | EPIDURAL | Status: DC | PRN
  Administered 2021-05-03: 12 mL/h via EPIDURAL
  Filled 2021-05-03: qty 250

## 2021-05-03 MED ORDER — ACETAMINOPHEN 325 MG PO TABS: 650.0000 mg | ORAL_TABLET | ORAL | Status: DC | PRN

## 2021-05-03 MED ORDER — DIPHENHYDRAMINE HCL 50 MG/ML IJ SOLN: 12.5000 mg | INTRAMUSCULAR | Status: DC | PRN

## 2021-05-03 MED ORDER — ONDANSETRON HCL 4 MG/2ML IJ SOLN: 4.0000 mg | Freq: Four times a day (QID) | INTRAMUSCULAR | Status: DC | PRN

## 2021-05-03 MED ORDER — FENTANYL CITRATE (PF) 100 MCG/2ML IJ SOLN
50.0000 ug | INTRAMUSCULAR | Status: DC | PRN
  Administered 2021-05-03: 50 ug via INTRAVENOUS
  Filled 2021-05-03: qty 2

## 2021-05-03 NOTE — Anesthesia Preprocedure Evaluation (Addendum)
Anesthesia Evaluation  ?Patient identified by MRN, date of birth, ID band ?Patient awake ? ? ? ?Reviewed: ?Allergy & Precautions, H&P , NPO status , Patient's Chart, lab work & pertinent test results ? ?History of Anesthesia Complications ?Negative for: history of anesthetic complications ? ?Airway ?Mallampati: II ? ?TM Distance: >3 FB ? ? ? ? Dental ?no notable dental hx. ? ?  ?Pulmonary ?asthma , former smoker,  ?  ?Pulmonary exam normal ? ? ? ? ? ? ? Cardiovascular ?hypertension,  ?Rhythm:regular Rate:Normal ? ? ?  ?Neuro/Psych ?negative neurological ROS ? negative psych ROS  ? GI/Hepatic ?negative GI ROS, Neg liver ROS,   ?Endo/Other  ?diabetes, Gestational, Oral Hypoglycemic AgentsMorbid obesity ? Renal/GU ?  ? ?  ?Musculoskeletal ? ? Abdominal ?(+) + obese,   ?Peds ? Hematology ?negative hematology ROS ?(+)   ?Anesthesia Other Findings ? ? Reproductive/Obstetrics ?(+) Pregnancy ? ?  ? ? ? ? ? ? ? ? ? ? ? ? ? ?  ?  ? ? ? ? ? ? ? ?Anesthesia Physical ?Anesthesia Plan ? ?ASA: 3 ? ?Anesthesia Plan: Epidural  ? ?Post-op Pain Management:   ? ?Induction:  ? ?PONV Risk Score and Plan: 2 and Treatment may vary due to age or medical condition ? ?Airway Management Planned: Natural Airway ? ?Additional Equipment:  ? ?Intra-op Plan:  ? ?Post-operative Plan:  ? ?Informed Consent: I have reviewed the patients History and Physical, chart, labs and discussed the procedure including the risks, benefits and alternatives for the proposed anesthesia with the patient or authorized representative who has indicated his/her understanding and acceptance.  ? ? ? ? ? ?Plan Discussed with: CRNA, Anesthesiologist and Surgeon ? ?Anesthesia Plan Comments: (C-section called for arrest of dilation. Plan to dose epidural for surgical analgesia. GETA as backup plan. Tanna Furry, MD  ?)  ? ? ? ? ? ?Anesthesia Quick Evaluation ? ?

## 2021-05-03 NOTE — Progress Notes (Addendum)
Labor Progress Note ?Alison Morales is a 21 y.o. G3P0020 at [redacted]w[redacted]d presented for IOL for post dates ?S:  ?Water broke and feels contractions more. FB also out now. Ok with check and considering epidural at this time ? ?O:  ?BP 140/85   Pulse 98   Temp 98.8 ?F (37.1 ?C) (Oral)   Resp 20   Ht 5\' 10"  (1.778 m)   Wt 125.4 kg   LMP 09/21/2020 Comment: Pt report heavy bleeding and clotsz  BMI 39.67 kg/m?  ?EFM: 145 baseline/moderate variability/accels ? ?CVE: Dilation: 4 ?Effacement (%): 60, 70 ?Cervical Position: Middle ?Station: Ballotable ?Presentation: Vertex ?Exam by:: Dr. Elder Negus ? ? ?A&P: 21 y.o. G3P0020 at [redacted]w[redacted]d presented for IOL for post dates ?S:  ?#Labor: Progressing well. Now s/p FB and SROM. Patient already on pitocin and will continue to uptitrate as appropriate. Head ballotable on check. Discussed sitting on birthing ball or walk to help facilitate fetal head descent ?#Pain: planning epidural ?#FWB: Cat I  ?#GBS negative ? ? ?Renard Matter, MD, MPH ?OB Fellow, Faculty Practice ? ?

## 2021-05-03 NOTE — H&P (Signed)
OBSTETRIC ADMISSION HISTORY AND PHYSICAL ? ?Alison Morales is a 21 y.o. female G3P0020 with IUP at 26w0dby UKoreapresenting for IOL 2/2 A2GDM. She reports +FMs, No LOF, no VB, no blurry vision, headaches or peripheral edema, and RUQ pain.  She plans on breast feeding. She requests ppIUD for birth control. ?[facility] ? ?Dating: By [place]--->  Estimated Date of Delivery: 05/10/21 ? ?Sono:   ? ?_0 , CWD, normal anatomy, Cephalic presentation,  22671I 30% EFW ? ? ?Prenatal History/Complications:  ?- [id number] Metformin  ?-Marginal insertion of Umbilical cord ?-LR NIPS ?-Hx of cHTN not on meds ?-Hx of ectopic pregnancy ? ?Past Medical History: ?Past Medical History:  ?Diagnosis Date  ? Asthma   ? Gestational diabetes   ? History of ectopic pregnancy 05/27/2020  ? Hypertension   ? ? ?Past Surgical History: ?Past Surgical History:  ?Procedure Laterality Date  ? ABDOMINAL HERNIA REPAIR    ? as a child  ? DIAGNOSTIC LAPAROSCOPY WITH REMOVAL OF ECTOPIC PREGNANCY N/A 05/10/2020  ? Procedure: DIAGNOSTIC LAPAROSCOPY;  Surgeon: PAletha Halim MD;  [place] [facility]  Service: Gynecology;  Laterality: N/A;  ? LAPAROSCOPIC UNILATERAL SALPINGECTOMY Left 05/10/2020  ? Procedure: LAPAROSCOPIC LEFT SALPINGECTOMY WITH REMOVAL OF ECTOPIC PREGNANCY;  Surgeon: PAletha Halim MD;  [place] [facility]  Service: Gynecology;  Laterality: Left;  ? ? ?Obstetrical History: ?OB History   ? ? Gravida  ?3  ? Para  ?   ? Term  ?   ? Preterm  ?   ? AB  ?2  ? Living  ?   ?  ? ? SAB  ?1  ? IAB  ?   ? Ectopic  ?1  ? Multiple  ?   ? Live Births  ?   ?   ?  ?  ? ? ?Social History ?Social History  ? ?Socioeconomic History  ? Marital status: Single  ?  Spouse name: Not on file  ? Number of children: Not on file  ? Years of education: Not on file  ? Highest education level: Not on file  ?Occupational History  ? Not on file  ?Tobacco Use  ? Smoking status: Former  ?  Types: Cigarettes  ? Smokeless tobacco: Never  ?Vaping Use  ?  Vaping Use: Never used  ?Substance and Sexual Activity  ? Alcohol use: Never  ? Drug use: Never  ? Sexual activity: Yes  ?Other Topics Concern  ? Not on file  ?Social History Narrative  ? Not on file  ? ?Social Determinants of Health  ? ?Financial Resource Strain: Not on file  ?Food Insecurity: No Food Insecurity  ? Worried About RCharity fundraiserin the Last Year: Never true  ? Ran Out of Food in the Last Year: Never true  ?Transportation Needs: No Transportation Needs  ? Lack of Transportation (Medical): No  ? Lack of Transportation (Non-Medical): No  ?Physical Activity: Not on file  ?Stress: Not on file  ?Social Connections: Not on file  ? ? ?Family History: ?Family History  ?Problem Relation Age of Onset  ? Depression Mother   ? Depression Father   ? Cancer Maternal Grandmother   ? Bipolar disorder Maternal Grandmother   ? Schizophrenia Maternal Grandfather   ? ? ?Allergies: ?Allergies  ?Allergen Reactions  ? Lactose Intolerance (Gi) Diarrhea  ? ? ?Pt denies allergies to latex, iodine, or shellfish. ? ?Medications Prior to Admission  ?Medication Sig Dispense Refill Last  Dose  ? Accu-Chek Softclix Lancets lancets Use as instructed; check blood glucose 4 times daily 100 each 6   ? acetaminophen (TYLENOL) 325 MG tablet Take 650 mg by mouth every 6 (six) hours as needed. (Patient not taking: Reported on 04/28/2021)     ? Blood Glucose Monitoring Suppl (ACCU-CHEK GUIDE ME) w/Device KIT as directed.     ? Blood Pressure Monitoring (BLOOD PRESSURE KIT) DEVI 1 Device by Does not apply route as needed. 1 each 0   ? glucose blood (ACCU-CHEK GUIDE) test strip Use as instructed; check blood glucose 4 times daily 100 each 6   ? metFORMIN (GLUCOPHAGE) 500 MG tablet Take 1 tablet (500 mg total) by mouth at bedtime. 30 tablet 5   ? Prenatal MV & Min w/FA-DHA (PRENATAL GUMMIES) 0.18-25 MG CHEW Chew 1 tablet by mouth daily. 90 tablet 3   ? ? ? ?Review of Systems  ? ?All systems reviewed and negative except as stated in  HPI ? ?Blood pressure 125/84, pulse 92, temperature 98.2 ?F (36.8 ?C), temperature source Oral, resp. rate 18, height _0  (1.778 m), weight 125.4 kg, last menstrual period 09/21/2020. ?General appearance: alert, cooperative, and no distress ?Lungs: clear to auscultation bilaterally ?Heart: regular rate and rhythm ?Abdomen: soft, non-tender; bowel sounds normal ?Extremities: Homans sign is negative, no sign of DVT ?Presentation: cephalic ?Fetal monitoringBaseline: 145 bpm, Variability: Good {> 6 bpm), and Accelerations: Reactive ?Uterine activityNone ?Dilation: 2 ?Effacement (%): 50 ?Station: Ballotable ?Exam by:: Rosana Hoes, RN ? ? ?Prenatal labs: ?ABO, Rh: --/--/A POS (04/24 6962) ?Antibody: NEG (04/24 0706) ?Rubella: 1.04 (12/09 9528) ?RPR: Non Reactive (02/01 4132)  ?HBsAg: Negative (12/09 0933)  ?HIV: Non Reactive (02/01 4401)  ?GBS: Negative/-- (04/05 1001)  ?1 hr Glucola: Normal ?Genetic screening:Normal,  LR NIPS ?Anatomy US: Marginal cord ? ?Prenatal Transfer Tool  ?Maternal Diabetes: Yes:  Diabetes Type:  Insulin/Medication controlled ?Genetic Screening: Normal ?Maternal Ultrasounds/Referrals: Other:Marginal cord ?Fetal Ultrasounds or other Referrals:  None ?Maternal Substance Abuse:  No ?Significant Maternal Medications:  None ?Significant Maternal Lab Results: Group B Strep negative ? ?Results for orders placed or performed during the hospital encounter of 05/03/21 (from the past 24 hour(s))  ?CBC  ? Collection Time: 05/03/21  7:06 AM  ?Result Value Ref Range  ? WBC 6.8 4.0 - 10.5 K/uL  ? RBC 4.86 3.87 - 5.11 MIL/uL  ? Hemoglobin 14.4 12.0 - 15.0 g/dL  ? HCT 42.0 36.0 - 46.0 %  ? MCV 86.4 80.0 - 100.0 fL  ? MCH 29.6 26.0 - 34.0 pg  ? MCHC 34.3 30.0 - 36.0 g/dL  ? RDW 13.9 11.5 - 15.5 %  ? Platelets 321 150 - 400 K/uL  ? nRBC 0.0 0.0 - 0.2 %  ?Type and screen  ? Collection Time: 05/03/21  7:06 AM  ?Result Value Ref Range  ? ABO/RH(D) A POS   ? Antibody Screen NEG   ? Sample Expiration    ?   05/06/2021,2359 ?Performed at [facility], [place] [address] [place], [place] [address] ?  ?Glucose, capillary  ? Collection Time: 05/03/21  7:18 AM  ?Result Value Ref Range  ? Glucose-Capillary 127 (H) 70 - 99 mg/dL  ? ? ?Patient Active Problem List  ? Diagnosis Date Noted  ? Gestational diabetes mellitus (GDM) 05/03/2021  ? Chronic hypertension affecting pregnancy 04/07/2021  ? Gestational diabetes mellitus (GDM) affecting pregnancy 02/11/2021  ? Marginal insertion of umbilical cord affecting management of mother 01/21/2021  ? Supervision of high  risk pregnancy, antepartum 12/18/2020  ? Obesity in pregnancy 12/18/2020  ? BMI 30s 12/18/2020  ? Late prenatal care affecting pregnancy in second trimester 12/18/2020  ? ? ?Assessment/Plan:  ?Alison Morales is a 21 y.o. G3P0020 at 35w0dhere for IOL due to A2GDM  ? ?#Labor:Progressing well patient came in at 1.5 CM dilated and  50% effaced. She has received Cytotec. Will continue routine induction management and reassess in 3-4 hours to with plan for possible FB placement. ?#Pain: IV PRN, Epidural at patient's request. ?#FWB: Cat 1  ?#ID:  GBS negative ?#MOF: Breast Feeding ?#MOC: ppIUD  ?#A2GDM: monitor CBG every 4 hours. ?#cHTN: BP is stable without medication. Continue routine BP and vitals.  ? ? ?DClaudina Lick Student-PA  ?[facility] [place]?05/03/2021, 9:49 AM ? ?  ?

## 2021-05-03 NOTE — Progress Notes (Addendum)
Alison Morales is a 21 y.o. G3P0020 at [redacted]w[redacted]d by ultrasound admitted for induction of labor due to Gestational diabetes. ? ?Subjective: ?Patient resting comfortably in bed. No acute distress. No current concerns.  ? ?Objective: ?BP (!) 138/92   Pulse (!) 109   Temp 98.1 ?F (36.7 ?C) (Axillary)   Resp 18   Ht 5\' 10"  (1.778 m)   Wt 125.4 kg   LMP 09/21/2020 Comment: Pt report heavy bleeding and clotsz  BMI 39.67 kg/m?  ?No intake/output data recorded. ?No intake/output data recorded. ? ?FHT:  FHR: 150 bpm, variability: moderate,  accelerations:  Present,  decelerations:  Absent ?UC:   Patient reports no contractions  ?SVE:   Dilation: 2 ?Effacement (%): 70 ?Station: Ballotable ?Exam by:: rebecca [email or web address],[email or web address] ? ?Labs: ?Lab Results  ?Component Value Date  ? WBC 6.8 05/03/2021  ? HGB 14.4 05/03/2021  ? HCT 42.0 05/03/2021  ? MCV 86.4 05/03/2021  ? PLT 321 05/03/2021  ? ? ?Assessment / Plan: ?Induction of labor due to gestational diabetes ? ?Labor: The patient currently has a foley balloon in place. She  reports no contractions at this time. Ultrasound was used to confirm the presentation of the baby which is vertex and Pitocin will begin at 2 units and will be tritiated up to 8 units until FB out. We will continue to monitor progression. Vital signs will be monitored.   ?Chronic 05/05/2021 normal,  no signs or symptoms of toxicity, normal range blood pressures ?Fetal Wellbeing:  Category I ?Pain Control:  Epidural and IV pain meds ?I/D:  n/a ?Anticipated MOD:  NSVD ? ?YPP:JKDT Leonard Downing PA-S Student ?05/03/2021, 6:28 PM ? ?I was personally present and re-performed the exam and medical decision making and verified the service and findings are accurately documented in the student's note. ? ?05/05/2021, MD ?05/03/2021 ?6:45 PM  ? ?Attestation of Supervision of Resident:  I confirm that I have verified the information documented in the resident?s note and that I have also personally performed  the history, physical exam and all medical decision making activities.  I have verified that all services and findings are accurately documented in this note; and I agree with management and plan as outlined in the documentation. I have also made any necessary editorial changes. ? ? ?05/05/2021, MD ?Center for [facility] ?05/03/2021 6:49 PM ? ?

## 2021-05-03 NOTE — Anesthesia Procedure Notes (Signed)
Epidural ?Patient location during procedure: OB ?Start time: 05/03/2021 9:22 PM ?End time: 05/03/2021 9:33 PM ? ?Staffing ?Anesthesiologist: Lidia Collum, MD ?Performed: anesthesiologist  ? ?Preanesthetic Checklist ?Completed: patient identified, IV checked, risks and benefits discussed, monitors and equipment checked, pre-op evaluation and timeout performed ? ?Epidural ?Patient position: sitting ?Prep: DuraPrep ?Patient monitoring: heart rate, continuous pulse ox and blood pressure ?Approach: midline ?Location: L3-L4 ?Injection technique: LOR air ? ?Needle:  ?Needle type: Tuohy  ?Needle gauge: 17 G ?Needle length: 9 cm ?Needle insertion depth: 9 cm ?Catheter type: closed end flexible ?Catheter size: 19 Gauge ?Catheter at skin depth: 14 cm ?Test dose: negative ? ?Assessment ?Events: blood not aspirated, injection not painful, no injection resistance, no paresthesia and negative IV test ? ?Additional Notes ?Reason for block:procedure for pain ? ? ? ?

## 2021-05-03 NOTE — Progress Notes (Signed)
Alison Morales is a 21 y.o. G3P0020 at [redacted]w[redacted]d by ultrasound admitted for induction of labor due to Gestational diabetes. ? ?Subjective: ?Patient is a 21 year old G3P0020 admitted for induction of labor due to type A2 Gestational diabetes. Patient joined in the room by father of baby. Patient is resting in hospital bed in no acute distress. Patient currently has no concerns and is tolerating the contractions. The patient is not experiencing any signs or symptoms of preeclampsia. The patient is agreeable to the foley balloon.  ? ?Objective: ?BP 123/86   Pulse (!) 106   Temp 97.7 ?F (36.5 ?C) (Axillary)   Resp 20   Ht 5\' 10"  (1.778 m)   Wt 125.4 kg   LMP 09/21/2020 Comment: Pt report heavy bleeding and clotsz  BMI 39.67 kg/m?  ?No intake/output data recorded. ?No intake/output data recorded. ? ?FHT:  FHR: 150 bpm, variability: moderate,  accelerations:  Present,  decelerations:  Absent ?UC:   Intermittent contractions  ?SVE:   Dilation: 2 ?Effacement (%): 50 ?Station: Ballotable ?Exam by:: [email or web address], RN ? ?Labs: ?Lab Results  ?Component Value Date  ? WBC 6.8 05/03/2021  ? HGB 14.4 05/03/2021  ? HCT 42.0 05/03/2021  ? MCV 86.4 05/03/2021  ? PLT 321 05/03/2021  ? ? ?Assessment / Plan: ?Induction of labor due to A2GDM, the patient is progressing well. The patient is agreeable to the Foley Balloon. ? ?Labor: Progressing normally, patient is having more frequent contractions, will hold off on second dose of Cytotec, will plan for Foley Balloon in next hour. ?Preeclampsia:  no signs or symptoms of toxicity ?Fetal Wellbeing:  Category I ?Pain Control:  Epidural and IV pain meds ?I/D:  n/a ?Anticipated MOD:  NSVD ? ?05/05/2021 Leonard Downing PA-S Student ?05/03/2021, 11:53 AM ? ?.v ? ?

## 2021-05-04 ENCOUNTER — Encounter (HOSPITAL_COMMUNITY)
Admission: AD | Disposition: A | Discharge status: Home / Self Care | Payer: Self-pay | Source: Home / Self Care | Attending: Family Medicine

## 2021-05-04 ENCOUNTER — Encounter (HOSPITAL_COMMUNITY): Payer: Self-pay | Admitting: Family Medicine

## 2021-05-04 DIAGNOSIS — Z98891 History of uterine scar from previous surgery: Secondary | ICD-10-CM

## 2021-05-04 DIAGNOSIS — Z3A39 39 weeks gestation of pregnancy: Secondary | ICD-10-CM

## 2021-05-04 LAB — GLUCOSE, CAPILLARY
Glucose-Capillary: 84 mg/dL (ref 70–99)
Glucose-Capillary: 91 mg/dL (ref 70–99)
Glucose-Capillary: 81 mg/dL (ref 70–99)

## 2021-05-04 SURGERY — Surgical Case
Anesthesia: Epidural

## 2021-05-04 MED ORDER — SODIUM BICARBONATE 8.4 % IV SOLN
INTRAVENOUS | Status: AC
  Filled 2021-05-04: qty 50

## 2021-05-04 MED ORDER — LACTATED RINGERS AMNIOINFUSION
INTRAVENOUS | Status: DC
  Administered 2021-05-04: 300 mL/h via INTRAUTERINE

## 2021-05-04 MED ORDER — OXYCODONE HCL 5 MG/5ML PO SOLN: 5.0000 mg | Freq: Once | ORAL | Status: DC | PRN

## 2021-05-04 MED ORDER — PHENYLEPHRINE HCL (PRESSORS) 10 MG/ML IV SOLN
INTRAVENOUS | Status: DC | PRN
Start: 2021-05-04 — End: 2021-05-04
  Administered 2021-05-04 (×2): 160 ug via INTRAVENOUS
  Administered 2021-05-04: 200 ug via INTRAVENOUS
  Administered 2021-05-04: 240 ug via INTRAVENOUS
  Administered 2021-05-04: 160 ug via INTRAVENOUS
  Administered 2021-05-04: 240 ug via INTRAVENOUS

## 2021-05-04 MED ORDER — FUROSEMIDE 20 MG PO TABS
20.0000 mg | ORAL_TABLET | Freq: Every day | ORAL | Status: DC
Start: 2021-05-05 — End: 2021-05-07
  Administered 2021-05-05 – 2021-05-07 (×3): 20 mg via ORAL
  Filled 2021-05-04 (×3): qty 1

## 2021-05-04 MED ORDER — KETOROLAC TROMETHAMINE 30 MG/ML IJ SOLN: 30.0000 mg | Freq: Four times a day (QID) | INTRAMUSCULAR | Status: DC | PRN

## 2021-05-04 MED ORDER — DEXMEDETOMIDINE (PRECEDEX) IN NS 20 MCG/5ML (4 MCG/ML) IV SYRINGE
PREFILLED_SYRINGE | INTRAVENOUS | Status: AC
  Filled 2021-05-04: qty 5

## 2021-05-04 MED ORDER — CEFAZOLIN IN SODIUM CHLORIDE 3-0.9 GM/100ML-% IV SOLN
3.0000 g | INTRAVENOUS | Status: AC
  Administered 2021-05-04: 3 g via INTRAVENOUS
  Filled 2021-05-04 (×2): qty 100

## 2021-05-04 MED ORDER — DIPHENHYDRAMINE HCL 50 MG/ML IJ SOLN: 12.5000 mg | INTRAMUSCULAR | Status: DC | PRN

## 2021-05-04 MED ORDER — ONDANSETRON HCL 4 MG/2ML IJ SOLN: 4.0000 mg | Freq: Three times a day (TID) | INTRAMUSCULAR | Status: DC | PRN

## 2021-05-04 MED ORDER — SODIUM CHLORIDE 0.9 % IV SOLN
INTRAVENOUS | Status: AC
  Filled 2021-05-04: qty 5

## 2021-05-04 MED ORDER — OXYTOCIN-SODIUM CHLORIDE 30-0.9 UT/500ML-% IV SOLN
INTRAVENOUS | Status: DC | PRN
Start: 2021-05-04 — End: 2021-05-04
  Administered 2021-05-04: 250 mL via INTRAVENOUS

## 2021-05-04 MED ORDER — SODIUM BICARBONATE 8.4 % IV SOLN
INTRAVENOUS | Status: DC | PRN
  Administered 2021-05-04 (×2): 5 mL via EPIDURAL

## 2021-05-04 MED ORDER — FENTANYL CITRATE (PF) 100 MCG/2ML IJ SOLN: 25.0000 ug | INTRAMUSCULAR | Status: DC | PRN

## 2021-05-04 MED ORDER — SODIUM CHLORIDE 0.9 % IV SOLN
2.0000 g | Freq: Four times a day (QID) | INTRAVENOUS | Status: DC
  Administered 2021-05-04 (×2): 2 g via INTRAVENOUS
  Filled 2021-05-04: qty 2000

## 2021-05-04 MED ORDER — DEXAMETHASONE SODIUM PHOSPHATE 4 MG/ML IJ SOLN
INTRAMUSCULAR | Status: DC | PRN
  Administered 2021-05-04: 8 mg via INTRAVENOUS

## 2021-05-04 MED ORDER — SCOPOLAMINE 1 MG/3DAYS TD PT72: 1.0000 | MEDICATED_PATCH | Freq: Once | TRANSDERMAL | Status: DC

## 2021-05-04 MED ORDER — METOCLOPRAMIDE HCL 5 MG/ML IJ SOLN
INTRAMUSCULAR | Status: AC
Start: 2021-05-04 — End: ?
  Filled 2021-05-04: qty 2

## 2021-05-04 MED ORDER — ENOXAPARIN SODIUM 60 MG/0.6ML IJ SOSY
60.0000 mg | PREFILLED_SYRINGE | INTRAMUSCULAR | Status: DC
  Administered 2021-05-05 – 2021-05-07 (×3): 60 mg via SUBCUTANEOUS
  Filled 2021-05-04 (×3): qty 0.6

## 2021-05-04 MED ORDER — ACETAMINOPHEN 500 MG PO TABS
1000.0000 mg | ORAL_TABLET | Freq: Four times a day (QID) | ORAL | Status: DC
  Administered 2021-05-04 – 2021-05-07 (×9): 1000 mg via ORAL
  Filled 2021-05-04 (×10): qty 2

## 2021-05-04 MED ORDER — PRENATAL MULTIVITAMIN CH
1.0000 | ORAL_TABLET | Freq: Every day | ORAL | Status: DC
  Administered 2021-05-05 – 2021-05-06 (×2): 1 via ORAL
  Filled 2021-05-04 (×2): qty 1

## 2021-05-04 MED ORDER — OXYCODONE HCL 5 MG PO TABS: 5.0000 mg | ORAL_TABLET | Freq: Once | ORAL | Status: DC | PRN

## 2021-05-04 MED ORDER — PROPOFOL 10 MG/ML IV BOLUS
INTRAVENOUS | Status: AC
  Filled 2021-05-04: qty 20

## 2021-05-04 MED ORDER — DEXMEDETOMIDINE (PRECEDEX) IN NS 20 MCG/5ML (4 MCG/ML) IV SYRINGE
PREFILLED_SYRINGE | INTRAVENOUS | Status: DC | PRN
Start: 2021-05-04 — End: 2021-05-04
  Administered 2021-05-04: 8 ug via INTRAVENOUS

## 2021-05-04 MED ORDER — DIPHENHYDRAMINE HCL 25 MG PO CAPS: 25.0000 mg | ORAL_CAPSULE | ORAL | Status: DC | PRN

## 2021-05-04 MED ORDER — MEPERIDINE HCL 25 MG/ML IJ SOLN: 6.2500 mg | INTRAMUSCULAR | Status: DC | PRN

## 2021-05-04 MED ORDER — SENNOSIDES-DOCUSATE SODIUM 8.6-50 MG PO TABS
2.0000 | ORAL_TABLET | Freq: Every day | ORAL | Status: DC
  Administered 2021-05-05 – 2021-05-07 (×3): 2 via ORAL
  Filled 2021-05-04 (×3): qty 2

## 2021-05-04 MED ORDER — SIMETHICONE 80 MG PO CHEW
80.0000 mg | CHEWABLE_TABLET | Freq: Three times a day (TID) | ORAL | Status: DC
  Administered 2021-05-05 – 2021-05-07 (×7): 80 mg via ORAL
  Filled 2021-05-04 (×7): qty 1

## 2021-05-04 MED ORDER — OXYTOCIN-SODIUM CHLORIDE 30-0.9 UT/500ML-% IV SOLN
INTRAVENOUS | Status: AC
  Filled 2021-05-04: qty 500

## 2021-05-04 MED ORDER — ONDANSETRON HCL 4 MG/2ML IJ SOLN
INTRAMUSCULAR | Status: AC
  Filled 2021-05-04: qty 2

## 2021-05-04 MED ORDER — OXYCODONE HCL 5 MG PO TABS: 5.0000 mg | ORAL_TABLET | ORAL | Status: DC | PRN

## 2021-05-04 MED ORDER — LIDOCAINE-EPINEPHRINE (PF) 2 %-1:200000 IJ SOLN: INTRAMUSCULAR | Status: DC | PRN

## 2021-05-04 MED ORDER — ACETAMINOPHEN 500 MG PO TABS: 1000.0000 mg | ORAL_TABLET | Freq: Four times a day (QID) | ORAL | Status: DC

## 2021-05-04 MED ORDER — NALOXONE HCL 4 MG/10ML IJ SOLN
1.0000 ug/kg/h | INTRAVENOUS | Status: DC | PRN
  Filled 2021-05-04: qty 5

## 2021-05-04 MED ORDER — DIBUCAINE (PERIANAL) 1 % EX OINT: 1.0000 "application " | TOPICAL_OINTMENT | CUTANEOUS | Status: DC | PRN

## 2021-05-04 MED ORDER — CEFAZOLIN IN SODIUM CHLORIDE 3-0.9 GM/100ML-% IV SOLN
INTRAVENOUS | Status: AC
  Filled 2021-05-04: qty 100

## 2021-05-04 MED ORDER — SIMETHICONE 80 MG PO CHEW: 80.0000 mg | CHEWABLE_TABLET | ORAL | Status: DC | PRN

## 2021-05-04 MED ORDER — MORPHINE SULFATE (PF) 0.5 MG/ML IJ SOLN
INTRAMUSCULAR | Status: AC
  Filled 2021-05-04: qty 10

## 2021-05-04 MED ORDER — CEFAZOLIN SODIUM-DEXTROSE 2-3 GM-%(50ML) IV SOLR: INTRAVENOUS | Status: DC | PRN

## 2021-05-04 MED ORDER — METOCLOPRAMIDE HCL 5 MG/ML IJ SOLN
INTRAMUSCULAR | Status: DC | PRN
Start: 2021-05-04 — End: 2021-05-04
  Administered 2021-05-04: 10 mg via INTRAVENOUS

## 2021-05-04 MED ORDER — SODIUM CHLORIDE 0.9% FLUSH: 3.0000 mL | INTRAVENOUS | Status: DC | PRN

## 2021-05-04 MED ORDER — DIPHENHYDRAMINE HCL 25 MG PO CAPS: 25.0000 mg | ORAL_CAPSULE | Freq: Four times a day (QID) | ORAL | Status: DC | PRN

## 2021-05-04 MED ORDER — MENTHOL 3 MG MT LOZG: 1.0000 | LOZENGE | OROMUCOSAL | Status: DC | PRN

## 2021-05-04 MED ORDER — COCONUT OIL OIL
1.0000 | TOPICAL_OIL | Status: DC | PRN
Start: 2021-05-04 — End: 2021-05-07

## 2021-05-04 MED ORDER — DEXAMETHASONE SODIUM PHOSPHATE 4 MG/ML IJ SOLN
INTRAMUSCULAR | Status: AC
Start: 2021-05-04 — End: ?
  Filled 2021-05-04: qty 1

## 2021-05-04 MED ORDER — NALOXONE HCL 0.4 MG/ML IJ SOLN: 0.4000 mg | INTRAMUSCULAR | Status: DC | PRN

## 2021-05-04 MED ORDER — PHENYLEPHRINE 80 MCG/ML (10ML) SYRINGE FOR IV PUSH (FOR BLOOD PRESSURE SUPPORT)
PREFILLED_SYRINGE | INTRAVENOUS | Status: AC
  Filled 2021-05-04: qty 10

## 2021-05-04 MED ORDER — KETOROLAC TROMETHAMINE 30 MG/ML IJ SOLN
30.0000 mg | Freq: Four times a day (QID) | INTRAMUSCULAR | Status: DC | PRN
  Administered 2021-05-04: 30 mg via INTRAVENOUS

## 2021-05-04 MED ORDER — KETOROLAC TROMETHAMINE 30 MG/ML IJ SOLN
30.0000 mg | Freq: Four times a day (QID) | INTRAMUSCULAR | Status: AC
  Administered 2021-05-05 (×3): 30 mg via INTRAVENOUS
  Filled 2021-05-04 (×3): qty 1

## 2021-05-04 MED ORDER — OXYTOCIN-SODIUM CHLORIDE 30-0.9 UT/500ML-% IV SOLN
2.5000 [IU]/h | INTRAVENOUS | Status: AC
  Administered 2021-05-05: 2.5 [IU]/h via INTRAVENOUS
  Filled 2021-05-04: qty 500

## 2021-05-04 MED ORDER — LACTATED RINGERS IV SOLN
INTRAVENOUS | Status: DC | PRN
Start: 2021-05-04 — End: 2021-05-04

## 2021-05-04 MED ORDER — STERILE WATER FOR IRRIGATION IR SOLN
Status: DC | PRN
  Administered 2021-05-04: 1

## 2021-05-04 MED ORDER — SCOPOLAMINE 1 MG/3DAYS TD PT72
MEDICATED_PATCH | TRANSDERMAL | Status: AC
  Filled 2021-05-04: qty 1

## 2021-05-04 MED ORDER — SODIUM CHLORIDE 0.9 % IR SOLN
Status: DC | PRN
  Administered 2021-05-04: 1

## 2021-05-04 MED ORDER — SCOPOLAMINE 1 MG/3DAYS TD PT72
1.0000 | MEDICATED_PATCH | Freq: Once | TRANSDERMAL | Status: AC
Start: 2021-05-04 — End: 2021-05-07
  Administered 2021-05-04: 1.5 mg via TRANSDERMAL

## 2021-05-04 MED ORDER — SODIUM CHLORIDE 0.9 % IV SOLN
2.0000 g | Freq: Two times a day (BID) | INTRAVENOUS | Status: AC
  Administered 2021-05-04 – 2021-05-05 (×2): 2 g via INTRAVENOUS
  Filled 2021-05-04 (×2): qty 2

## 2021-05-04 MED ORDER — SOD CITRATE-CITRIC ACID 500-334 MG/5ML PO SOLN: 30.0000 mL | Freq: Once | ORAL | Status: DC

## 2021-05-04 MED ORDER — ONDANSETRON HCL 4 MG/2ML IJ SOLN: 4.0000 mg | Freq: Once | INTRAMUSCULAR | Status: DC | PRN

## 2021-05-04 MED ORDER — CHLORHEXIDINE GLUCONATE 0.12 % MT SOLN
OROMUCOSAL | Status: AC
  Filled 2021-05-04: qty 15

## 2021-05-04 MED ORDER — TRANEXAMIC ACID-NACL 1000-0.7 MG/100ML-% IV SOLN
1000.0000 mg | INTRAVENOUS | Status: AC
  Administered 2021-05-04: 1000 mg via INTRAVENOUS

## 2021-05-04 MED ORDER — ONDANSETRON HCL 4 MG/2ML IJ SOLN
INTRAMUSCULAR | Status: DC | PRN
  Administered 2021-05-04: 4 mg via INTRAVENOUS

## 2021-05-04 MED ORDER — WITCH HAZEL-GLYCERIN EX PADS: 1.0000 "application " | MEDICATED_PAD | CUTANEOUS | Status: DC | PRN

## 2021-05-04 MED ORDER — KETOROLAC TROMETHAMINE 30 MG/ML IJ SOLN
INTRAMUSCULAR | Status: AC
  Filled 2021-05-04: qty 1

## 2021-05-04 MED ORDER — GENTAMICIN SULFATE 40 MG/ML IJ SOLN
5.0000 mg/kg | Freq: Once | INTRAVENOUS | Status: AC
  Administered 2021-05-04: 460 mg via INTRAVENOUS
  Filled 2021-05-04: qty 11.5

## 2021-05-04 MED ORDER — IBUPROFEN 600 MG PO TABS
600.0000 mg | ORAL_TABLET | Freq: Four times a day (QID) | ORAL | Status: DC
  Administered 2021-05-05 – 2021-05-07 (×7): 600 mg via ORAL
  Filled 2021-05-04 (×7): qty 1

## 2021-05-04 MED ORDER — FENTANYL CITRATE (PF) 100 MCG/2ML IJ SOLN
INTRAMUSCULAR | Status: DC | PRN
  Administered 2021-05-04: 100 ug via EPIDURAL

## 2021-05-04 MED ORDER — ORAL CARE MOUTH RINSE: 15.0000 mL | Freq: Once | OROMUCOSAL | Status: AC

## 2021-05-04 MED ORDER — LACTATED RINGERS IV SOLN: INTRAVENOUS | Status: DC

## 2021-05-04 MED ORDER — SODIUM CHLORIDE 0.9 % IV SOLN
500.0000 mg | INTRAVENOUS | Status: AC
  Administered 2021-05-04: 500 mg via INTRAVENOUS

## 2021-05-04 MED ORDER — AMISULPRIDE (ANTIEMETIC) 5 MG/2ML IV SOLN
10.0000 mg | Freq: Once | INTRAVENOUS | Status: DC | PRN
  Filled 2021-05-04: qty 4

## 2021-05-04 MED ORDER — CHLORHEXIDINE GLUCONATE 0.12 % MT SOLN
15.0000 mL | Freq: Once | OROMUCOSAL | Status: AC
  Administered 2021-05-04: 15 mL via OROMUCOSAL
  Filled 2021-05-04: qty 15

## 2021-05-04 MED ORDER — MORPHINE SULFATE (PF) 0.5 MG/ML IJ SOLN
INTRAMUSCULAR | Status: DC | PRN
  Administered 2021-05-04: 3 mg via EPIDURAL

## 2021-05-04 MED ORDER — SOD CITRATE-CITRIC ACID 500-334 MG/5ML PO SOLN
30.0000 mL | ORAL | Status: AC
  Administered 2021-05-04: 30 mL via ORAL

## 2021-05-04 MED ORDER — FENTANYL CITRATE (PF) 100 MCG/2ML IJ SOLN
INTRAMUSCULAR | Status: AC
  Filled 2021-05-04: qty 2

## 2021-05-04 SURGICAL SUPPLY — 36 items
BENZOIN TINCTURE PRP APPL 2/3 (GAUZE/BANDAGES/DRESSINGS) ×2 IMPLANT
CHLORAPREP W/TINT 26ML (MISCELLANEOUS) ×4 IMPLANT
CLAMP CORD UMBIL (MISCELLANEOUS) ×2 IMPLANT
CLOTH BEACON ORANGE TIMEOUT ST (SAFETY) ×2 IMPLANT
DERMABOND ADVANCED (GAUZE/BANDAGES/DRESSINGS) ×1
DERMABOND ADVANCED .7 DNX12 (GAUZE/BANDAGES/DRESSINGS) IMPLANT
DRSG OPSITE POSTOP 4X10 (GAUZE/BANDAGES/DRESSINGS) ×2 IMPLANT
EXTRACTOR VACUUM KIWI (MISCELLANEOUS) ×1 IMPLANT
EXTRACTOR VACUUM M CUP 4 TUBE (SUCTIONS) IMPLANT
GLOVE BIOGEL PI IND STRL 7.0 (GLOVE) ×2 IMPLANT
GLOVE BIOGEL PI IND STRL 7.5 (GLOVE) ×2 IMPLANT
GLOVE BIOGEL PI INDICATOR 7.0 (GLOVE) ×2
GLOVE BIOGEL PI INDICATOR 7.5 (GLOVE) ×2
GLOVE ECLIPSE 7.5 STRL STRAW (GLOVE) ×2 IMPLANT
GOWN STRL REUS W/TWL LRG LVL3 (GOWN DISPOSABLE) ×6 IMPLANT
HEMOSTAT ARISTA ABSORB 3G PWDR (HEMOSTASIS) ×1 IMPLANT
KIT ABG SYR 3ML LUER SLIP (SYRINGE) IMPLANT
NDL HYPO 25X5/8 SAFETYGLIDE (NEEDLE) IMPLANT
NEEDLE HYPO 25X5/8 SAFETYGLIDE (NEEDLE) ×2 IMPLANT
NS IRRIG 1000ML POUR BTL (IV SOLUTION) ×2 IMPLANT
PACK C SECTION WH (CUSTOM PROCEDURE TRAY) ×2 IMPLANT
PAD OB MATERNITY 4.3X12.25 (PERSONAL CARE ITEMS) ×2 IMPLANT
RTRCTR C-SECT PINK 25CM LRG (MISCELLANEOUS) ×2 IMPLANT
STRIP CLOSURE SKIN 1/2X4 (GAUZE/BANDAGES/DRESSINGS) ×2 IMPLANT
SUT MNCRL 0 VIOLET CTX 36 (SUTURE) IMPLANT
SUT MON AB 4-0 PS1 27 (SUTURE) ×1 IMPLANT
SUT MONOCRYL 0 CTX 36 (SUTURE) ×2
SUT PLAIN 2 0 (SUTURE) ×1
SUT PLAIN ABS 2-0 CT1 27XMFL (SUTURE) IMPLANT
SUT VIC AB 0 CTX 36 (SUTURE) ×1
SUT VIC AB 0 CTX36XBRD ANBCTRL (SUTURE) ×3 IMPLANT
SUT VIC AB 2-0 CT1 27 (SUTURE) ×1
SUT VIC AB 2-0 CT1 TAPERPNT 27 (SUTURE) ×1 IMPLANT
TOWEL OR 17X24 6PK STRL BLUE (TOWEL DISPOSABLE) ×2 IMPLANT
TRAY FOLEY W/BAG SLVR 14FR LF (SET/KITS/TRAYS/PACK) ×2 IMPLANT
WATER STERILE IRR 1000ML POUR (IV SOLUTION) ×2 IMPLANT

## 2021-05-04 NOTE — Progress Notes (Signed)
Transferred to stretcher and transported to OR for cesarean section. ?

## 2021-05-04 NOTE — Progress Notes (Signed)
Labor Progress Note ?Alison Morales is a 21 y.o. G3P0020 at [redacted]w[redacted]d who presented for IOL due  to cHTN and A2GDM.  ? ?S: Doing well. No concerns.  ? ?O:  ?BP (!) 141/85   Pulse (!) 118   Temp 98.5 ?F (36.9 ?C) (Oral)   Resp 18   Ht 5\' 10"  (1.778 m)   Wt 125.4 kg   LMP 09/21/2020 Comment: Pt report heavy bleeding and clotsz  SpO2 99%   BMI 39.67 kg/m?  ? ?EFM: Baseline 150 bpm, minimal variability, + accels, intermittent late/variable decels  ?Toco: Every 1-5 minutes, MVUs inadequate  ? ?CVE: Dilation: 4 ?Effacement (%): 80 ?Cervical Position: Middle ?Station: -3 ?Presentation: Vertex ?Exam by:: Dr. [email or web address] ? ?A&P: 21 y.o. 21 [redacted]w[redacted]d  ? ?#Labor: SVE unchanged. Pitocin at 10 milli-units/min. Persistent Cat 2 tracing, unable to continue to titrate Pitocin with current tracing. Recommended cesarean section for fetal intolerance of labor and arrest of dilation.  ? ?The risks of surgery were discussed with the patient including but were not limited to: bleeding which may require transfusion or reoperation; infection which may require antibiotics; injury to bowel, bladder, ureters or other surrounding organs; injury to the fetus; need for additional procedures including hysterectomy in the event of a life-threatening hemorrhage; formation of adhesions; placental abnormalities with subsequent pregnancies; incisional problems; thromboembolic phenomenon and other postoperative/anesthesia complications.  The patient concurred with the proposed plan, giving informed written consent for the procedure.  Anesthesia and OR aware. Preoperative prophylactic antibiotics and SCDs ordered on call to the OR.  To OR when ready. ? ?#Pain: Epidural  ? ?#GBS negative; Amp/Gent for triple I - Will give Ancef and Azithro for surgical prophylaxis. Plan for TXA at delivery.  ? ?#A2GDM: CBGS within normal range.  ? ?#cHTN: Normal to mild range BP.  ? ?[redacted]w[redacted]d, MD ?2:54 PM ? ?

## 2021-05-04 NOTE — Progress Notes (Signed)
Talked with patient at bedside. ? ?Feeling comfortable. Discussed that at this time tracing continues to have periods of minimal variability with intermittent moderate variability. We discussed that once antibiotics are in we can reassess tracing. If having periods of moderate variability and not deceling will likely plan for trying to increase pitocin to attempt to facilitate dilation.  ? ?We discussed in depth that if there are signs of fetal distress (such as repetitive late or variable decels not resolved with usual resuscitative methods) then likely there would be concern for fetal intolerance of labor and likely cesarean delivery would be indicated. ? ? However given decreased risk and added benefits of vaginal delivery at this time if tracing is overall ok could trial increasing pitocin. Patient amenable and would like to try pitocin increase as long as appropriate at this time. ? ? Patient ultimately states she understands birth and labor is unpredictable and she would be ok with cesarean delivery if there are fetal indications for such/if fetus doesn't tolerate the increase in pitocin. ? ?We discussed her day time providers would come meet her in the next hour or so and would further discuss plan and manage her care. ? ? ?Warner Mccreedy, MD, MPH ?OB Fellow, Faculty Practice ? ?

## 2021-05-04 NOTE — Op Note (Signed)
Alison Morales ? ?PROCEDURE DATE: 05/04/2021 ? ?PREOPERATIVE DIAGNOSES: Intrauterine pregnancy at [redacted]w[redacted]d weeks gestation; failure to progress: arrest of dilation and non-reassuring fetal status ? ?POSTOPERATIVE DIAGNOSES: The same ? ?PROCEDURE: Primary Low Transverse Cesarean Section ? ?SURGEON:  Dr. Candelaria Celeste ? ?ASSISTANT:  Dr. Evalina Field  ? ?ANESTHESIOLOGY TEAM: Anesthesiologist: Mellody Dance, MD; Lucretia Kern, MD ?CRNA: Graciela Husbands, CRNA ? ?INDICATIONS: Alison Morales is a 21 y.o. [phone] at [redacted]w[redacted]d here for cesarean section secondary to the indications listed under preoperative diagnoses; please see preoperative note for further details.  The risks of cesarean section were discussed with the patient including but were not limited to: bleeding which may require transfusion or reoperation; infection which may require antibiotics; injury to bowel, bladder, ureters or other surrounding organs; injury to the fetus; need for additional procedures including hysterectomy in the event of a life-threatening hemorrhage; placental abnormalities wth subsequent pregnancies, incisional problems, thromboembolic phenomenon and other postoperative/anesthesia complications.   The patient concurred with the proposed plan, giving informed written consent for the procedure.   ? ?FINDINGS:  Viable female infant in cephalic presentation.  Apgars 9 and 9.  Clear amniotic fluid.  Intact placenta, three vessel cord.  Normal uterus. Normal right fallopian tube and left ovary visualized.  ? ?ANESTHESIA: Epidural  ?INTRAVENOUS FLUIDS: 2000 ml   ?ESTIMATED BLOOD LOSS: 720 ml ?URINE OUTPUT:  200 ml ?SPECIMENS: Placenta sent to L&D ?COMPLICATIONS: None immediate ? ?PROCEDURE IN DETAIL:   ?The patient preoperatively received intravenous antibiotics and had sequential compression devices applied to her lower extremities.  She was then taken to the operating room where the epidural anesthesia was dosed up to surgical  level and was found to be adequate. She was then placed in a dorsal supine position with a leftward tilt, and prepped and draped in a sterile manner.  A foley catheter was in place.   ? ?After an adequate timeout was performed, a Pfannenstiel skin incision was made with scalpel and carried through to the underlying layer of fascia. The fascia was incised in the midline, and this incision was extended bilaterally bluntly.  The rectus muscles were separated in the midline and the peritoneum was entered bluntly. The Alexis self-retaining retractor was introduced into the abdominal cavity.   ? ?Attention was turned to the lower uterine segment where a low transverse hysterotomy was made with a scalpel and extended bilaterally bluntly.  The infant was successfully delivered, the cord was clamped and cut after one minute, and the infant was handed over to the awaiting neonatology team. Uterine massage was then administered, and the placenta delivered intact with a three-vessel cord. The uterus was then cleared of clots and debris.   ? ?The hysterotomy was closed with 0 Monocryl in a running locked fashion.  The pelvis was cleared of all clot and debris. Hemostasis was confirmed on all surfaces.  The retractor was removed.  Arista was applied.   ? ?The fascia was then closed using 0 Vicryl in a running fashion.  The subcutaneous layer was irrigated, re-approximated with a 2-0 plain gut running stitch, and the skin was closed with a 4-0 Monocryl subcuticular stitch. The patient tolerated the procedure well. Sponge, instrument and needle counts were correct x 3.  She was taken to the recovery room in stable condition.  ? ?Evalina Field, MD ?[facility] Fellow  Faculty Practice  ?

## 2021-05-04 NOTE — Progress Notes (Signed)
Alison Morales is a 21 y.o. G3P0020 at [redacted]w[redacted]d admitted for induction of labor due to [place] and A2GDM. ? ?Assessed at bedside as patient was having late and variable decels. ? ?Subjective: ?Patient reports she feels well after epidural ? ?Objective: ?BP 124/69   Pulse 92   Temp 98.2 ?F (36.8 ?C) (Oral)   Resp 20   Ht 5\' 10"  (1.778 m)   Wt 125.4 kg   LMP 09/21/2020 Comment: Pt report heavy bleeding and clotsz  SpO2 99%   BMI 39.67 kg/m?  ?No intake/output data recorded. ?No intake/output data recorded. ? ?FHT:  FHR: 150 bpm, variability: moderate,  accelerations:  Present,  decelerations:  Present intermittent late decels ?UC:   regular, every 1-3 minutes ?SVE:   Dilation: 4.5 (cervical exam per pt request) ?Effacement (%): 60 ?Station: -3 ?Exam by:: [email or web address], RN ? ?Labs: ?Lab Results  ?Component Value Date  ? WBC 9.2 05/03/2021  ? HGB 13.4 05/03/2021  ? HCT 40.3 05/03/2021  ? MCV 85.9 05/03/2021  ? PLT 270 05/03/2021  ? ? ?Assessment / Plan: ?G3P0020 at [redacted]w[redacted]d admitted for induction of labor due to [facility] and A2GDM. ? ?Labor:  patient's cervix ~4-5 cm. IUPC placed. We turned down pitocin from 10 to 4 in setting of decels. Decels resolved. If variable decels return will plan for amnioinfusion. Variability minimal intermittently. Will monitor closely for MVUS and reuptitrate pitocin as appropriate ? ?Fetal Wellbeing:  Category II intermittently, decels resolved. Intermittent minimal variability. Will continue to monitor ?Pain Control:  Epidural ?I/D:   GBS neg ? ? ?#cHTN ?BP here normotensive. Continue to monitor ? ?#A2GDM ?All BG within range (80s-90s). Continue to monitor q4hr for now and switch to q6hr when more active ? ? ?[facility], MD, MPH ?OB Fellow, Faculty Practice ? ? ?

## 2021-05-04 NOTE — Progress Notes (Signed)
Labor Progress Note ?Corrissa Lesleigh Prestigiacomo is a 21 y.o. G3P0020 at [redacted]w[redacted]d who presented for IOL due to cHTN and A2GDM. ? ?S: Doing well. No concerns. Partner at bedside.  ? ?O:  ?BP 126/73   Pulse (!) 108   Temp 99 ?F (37.2 ?C) (Oral)   Resp 20   Ht 5\' 10"  (1.778 m)   Wt 125.4 kg   LMP 09/21/2020 Comment: Pt report heavy bleeding and clotsz  SpO2 99%   BMI 39.67 kg/m?  ? ?EFM: Baseline 150 bpm, minimal variability, + accels, intermittent late decelerations  ?Toco: Every 2-4 minutes, coupling pattern  ? ?CVE: Dilation: 5 ?Effacement (%): 60 ?Cervical Position: Middle ?Station: -3 ?Presentation: Vertex ?Exam by:: Domingo Pulse, RN ? ?A&P: 21 y.o. [id number] [redacted]w[redacted]d  ? ?#Labor: Previously had Pitocin rate lowered overnight due to late decelerations with minimal variability. FHT somewhat improved, has intermittent accels and periods of moderate variability. Contraction pattern inadequate. Increasing Pitocin as tolerated. Will trial amnioinfusion to see if this helps with fetal tolerance of contractions. If not improved, discussed possibility of need for a C-section. Patient voiced understanding. All questions and concerns addressed. Dr. Nehemiah Settle updated on plan of care. ?#Pain: Epidural  ?#FWB: Cat 2  ?#GBS negative ? ?#A2GDM: CBGs at goal.  ? ?#cHTN: BP normal range. Will continue to monitor.  ? ?Genia Del, MD ?10:59 AM ?

## 2021-05-04 NOTE — Anesthesia Postprocedure Evaluation (Signed)
Anesthesia Post Note ? ?Patient: Alison Morales ? ?Procedure(s) Performed: CESAREAN SECTION ? ?  ? ?Patient location during evaluation: PACU ?Anesthesia Type: Epidural ?Level of consciousness: awake and alert ?Pain management: pain level controlled ?Vital Signs Assessment: post-procedure vital signs reviewed and stable ?Respiratory status: spontaneous breathing, nonlabored ventilation and respiratory function stable ?Cardiovascular status: stable ?Postop Assessment: no headache, no backache and epidural receding ?Anesthetic complications: no ? ? ?No notable events documented. ? ?Last Vitals:  ?Vitals:  ? 05/04/21 1700 05/04/21 1715  ?BP: 132/88 134/87  ?Pulse: (!) 104 (!) 101  ?Resp: (!) 21 14  ?Temp:    ?SpO2: 100% 100%  ?  ?Last Pain:  ?Vitals:  ? 05/04/21 1620  ?TempSrc:   ?PainSc: 0-No pain  ? ?Pain Goal:   ? ?  ?  ?  ?  ?  ?  ?Epidural/Spinal Function Cutaneous sensation: Able to Discern Pressure (05/04/21 1715), Patient able to flex knees: No (05/04/21 1715), Patient able to lift hips off bed: No (05/04/21 1715), Back pain beyond tenderness at insertion site: No (05/04/21 1715), Progressively worsening motor and/or sensory loss: No (05/04/21 1715) ? ?Mellody Dance ? ? ? ? ?

## 2021-05-04 NOTE — Lactation Note (Signed)
This note was copied from a baby's chart. ?Lactation Consultation Note ?Mom holding baby STS, hasn't latched baby yet. ?[place] assisted baby to the breast. Baby latched well after several attempts. Baby held nipple w/occasional suckle then started suckling well. ?Newborn feeding habits, STS, I&O, hand expression after BF for next couple of days and spoon feed colostrum for desert since GDM.\ ?Mom encouraged to feed baby 8-12 times/24 hours and with feeding cues.   ?Praised mom for all her colostrum. ?Encouraged to call for assistance as needed. ? ?Patient Name: Alison Morales ?Today's Date: 05/04/2021 ?Reason for consult: Initial assessment;Primapara;Term;Maternal endocrine disorder ?Age:23 hours ? ?Maternal Data ?Has patient been taught Hand Expression?: Yes ?Does the patient have breastfeeding experience prior to this delivery?: No ? ?Feeding ?  ? ?LATCH Score ?Latch: Grasps breast easily, tongue down, lips flanged, rhythmical sucking. ? ?Audible Swallowing: A few with stimulation ? ?Type of Nipple: Everted at rest and after stimulation ? ?Comfort (Breast/Nipple): Soft / non-tender ? ?Hold (Positioning): Assistance needed to correctly position infant at breast and maintain latch. ? ?LATCH Score: 8 ? ? ?Lactation Tools Discussed/Used ?  ? ?Interventions ?Interventions: Breast feeding basics reviewed;Assisted with latch;Skin to skin;Breast massage;Hand express;Breast compression;Adjust position;Support pillows;Position options;LC Services brochure ? ?Discharge ?  ? ?Consult Status ?Consult Status: Follow-up ?Date: 05/05/21 ?Follow-up type: In-patient ? ? ? ?Theodoro Kalata ?05/04/2021, 8:19 PM ? ? ? ?

## 2021-05-04 NOTE — Discharge Summary (Addendum)
? ?  Postpartum Discharge Summary ? ? ? ?   ?Patient Name: Alison Morales ?DOB: 2000-01-25 ?MRN: [id number] ? ?Date of admission: 05/03/2021 ?Delivery date:05/04/2021  ?Delivering provider: Truett Mainland  ?Date of discharge: 05/07/2021 ? ?Admitting diagnosis: Gestational diabetes mellitus (GDM) [O24.419] ?Intrauterine pregnancy: [redacted]w[redacted]d    ?Secondary diagnosis:  Principal Problem: ?  Gestational diabetes mellitus (GDM) ?Active Problems: ?  Supervision of high risk pregnancy, antepartum ?  Obesity in pregnancy ?  Late prenatal care affecting pregnancy in second trimester ?  Marginal insertion of umbilical cord affecting management of mother ?  Gestational diabetes mellitus (GDM) affecting pregnancy ?  Chronic hypertension affecting pregnancy ?  S/P cesarean section ? ?Additional problems: none    ?Discharge diagnosis: Term Pregnancy Delivered                                              ?Post partum procedures: none ?Augmentation: Pitocin, Cytotec, and IP Foley ?Complications: Intrauterine Inflammation or infection (Chorioamniotis) ? ?Hospital course: Induction of Labor With Cesarean Section   ?21y.o. yo [id number] admitted to the hospital 05/03/2021 for induction of labor. She received Cytotec and foley balloon placement followed by SROM. She was then started on Pitocin, which had to be intermittently held/lowered due to late decelerations. She was started on Amp/Gent on 4/25 due to concern for development of triple I. Trial of Pitocin then again restarted and patient got up to 10 milli-units/min before it was discontinued again for persistent Cat 2 tracing with minimal variability and late decelerations and unchanged SVE at 4 cm. The patient went for cesarean section due to Arrest of Dilation and Non-Reassuring FHR. Delivery details are as follows: ?Membrane Rupture Time/Date: 8:01 PM ,05/03/2021   ?Delivery Method:C-Section, Low Transverse  ?Details of operation can be found in separate operative  note.  Patient had an uncomplicated postpartum course.  Her hemoglobin on POD#1 was 10.7, for which she received PO Iron.  Her fasting CBG was 91, 81.  She was started on Lasix on POD#1, and she will continue this upon discharge to complete a 5 day course.  Her BP was 123=130s/70=80s.  She is ambulating, tolerating a regular diet, passing flatus, and urinating well.  Patient is discharged home in stable condition on 05/07/21.     ? ?Newborn Data: ?Birth date:05/04/2021  ?Birth time:3:26 PM  ?Gender:Female  ?Living status:Living  ?Apgars:9 ,9  ?Weight:3120 g                               ? ?Magnesium Sulfate received: No ?BMZ received: No ?Rhophylac: N/A ?MMR: N/A ?T-DaP: Given prenatally ?Flu: No ?Transfusion: No  ? ?Physical exam  ?Vitals:  ? 05/06/21 0535 05/06/21 1341 05/06/21 2202 05/07/21 0512  ?BP: 134/82 109/71 127/79 133/85  ?Pulse: 99 100 96 94  ?Resp:  _0 ?Temp:  98 ?F (36.7 ?C) 98.2 ?F (36.8 ?C) 98 ?F (36.7 ?C)  ?TempSrc:  Oral Oral Oral  ?SpO2: 99% 99% 100% 100%  ?Weight:      ?Height:      ? ?General: alert, cooperative, and no distress ?Lochia: appropriate ?Uterine Fundus: firm ?Incision: Healing well with no significant drainage ?DVT Evaluation: No evidence of DVT seen on physical exam. ? ?Labs: ?Lab Results  ?Component Value Date  ?  WBC 16.8 (H) 05/05/2021  ? HGB 10.7 (L) 05/05/2021  ? HCT 32.0 (L) 05/05/2021  ? MCV 85.8 05/05/2021  ? PLT 232 05/05/2021  ? ? ?  Latest Ref Rng & Units 05/03/2021  ?  9:21 AM  ?CMP  ?Glucose 70 - 99 mg/dL 113    ?BUN 6 - 20 mg/dL 8    ?Creatinine 0.44 - 1.00 mg/dL 0.81    ?Sodium 135 - 145 mmol/L 136    ?Potassium 3.5 - 5.1 mmol/L 4.6    ?Chloride 98 - 111 mmol/L 109    ?CO2 22 - 32 mmol/L 18    ?Calcium 8.9 - 10.3 mg/dL 9.2    ?Total Protein 6.5 - 8.1 g/dL 6.6    ?Total Bilirubin 0.3 - 1.2 mg/dL 0.5    ?Alkaline Phos 38 - 126 U/L 137    ?AST 15 - 41 U/L 34    ?ALT 0 - 44 U/L 15    ? ?Edinburgh Score: ? ?  05/04/2021  ?  8:00 PM  ?Edinburgh Postnatal Depression  Scale Screening Tool  ?I have been able to laugh and see the funny side of things. 0  ?I have looked forward with enjoyment to things. 0  ?I have blamed myself unnecessarily when things went wrong. 0  ?I have been anxious or worried for no good reason. 2  ?I have felt scared or panicky for no good reason. 1  ?Things have been getting on top of me. 0  ?I have been so unhappy that I have had difficulty sleeping. 0  ?I have felt sad or miserable. 0  ?I have been so unhappy that I have been crying. 0  ?The thought of harming myself has occurred to me. 0  ?Edinburgh Postnatal Depression Scale Total 3  ? ? ? ?After visit meds:  ?Allergies as of 05/07/2021   ? ?   Reactions  ? Lactose Intolerance (gi) Diarrhea  ? ?  ? ?  ?Medication List  ?  ? ?STOP taking these medications   ? ?Accu-Chek Guide Me w/Device Kit ?  ?Accu-Chek Guide test strip ?Generic drug: glucose blood ?  ?Accu-Chek Softclix Lancets lancets ?  ?acetaminophen 325 MG tablet ?Commonly known as: TYLENOL ?  ?metFORMIN 500 MG tablet ?Commonly known as: GLUCOPHAGE ?  ? ?  ? ?TAKE these medications   ? ?Blood Pressure Kit Devi ?1 Device by Does not apply route as needed. ?  ?furosemide 20 MG tablet ?Commonly known as: LASIX ?Take 1 tablet (20 mg total) by mouth daily for 3 days. ?  ?ibuprofen 600 MG tablet ?Commonly known as: ADVIL ?Take 1 tablet (600 mg total) by mouth every 6 (six) hours. ?  ?oxyCODONE 5 MG immediate release tablet ?Commonly known as: Oxy IR/ROXICODONE ?Take 1-2 tablets (5-10 mg total) by mouth every 6 (six) hours as needed for moderate pain. ?  ?Prenatal Gummies 0.18-25 MG Chew ?Chew 1 tablet by mouth daily. ?  ? ?  ? ? ? ?Discharge home in stable condition ?Infant Feeding: Breast ?Infant Disposition:home with mother ?Discharge instruction: per After Visit Summary and Postpartum booklet. ?Activity: Advance as tolerated. Pelvic rest for 6 weeks.  ?Diet: routine diet ?Future Appointments: ?Future Appointments  ?Date Time Provider [place]   ?05/12/2021  1:30 PM [facility] NURSE [facility] [place]  ?06/02/2021  8:15 AM Clarnce Flock, MD [facility]  ?06/02/2021  8:50 AM [facility] LAB [facility]  ? ?Follow up Visit: ? Follow-up Information   ? ? [facility]. Schedule an appointment as soon as possible for a visit in 1 week(s).   ?Specialty: Obstetrics and Gynecology ?Contact information: ?[address] ?[place] [phone] ?[phone] ? ?  ?  ? ?  ?  ? ?  ? ?Message sent to [facility] by Dr. Gwenlyn Perking on 05/04/21.  ? ?Please schedule this patient for a In person postpartum visit in 6 weeks with the following provider: Any provider. ?Additional Postpartum F/U: 2 hour GTT, Incision check 1 week, and BP check 1 week  ?High risk pregnancy complicated by: GDM and HTN ?Delivery mode:  C-Section, Low Transverse  ?Anticipated Birth Control:  IUD outpatient at postpartum visit  ? ?05/07/2021 ?Hansel Feinstein, CNM ? ? ? ?

## 2021-05-04 NOTE — Transfer of Care (Signed)
Immediate Anesthesia Transfer of Care Note ? ?Patient: Alison Morales ? ?Procedure(s) Performed: CESAREAN SECTION ? ?Patient Location: PACU ? ?Anesthesia Type:Epidural ? ?Level of Consciousness: awake, alert  and oriented ? ?Airway & Oxygen Therapy: Patient Spontanous Breathing ? ?Post-op Assessment: Report given to RN and Post -op Vital signs reviewed and stable ? ?Post vital signs: Reviewed and stable ? ?Last Vitals:  ?Vitals Value Taken Time  ?BP 133/94 05/04/21 1620  ?Temp    ?Pulse 103 05/04/21 1623  ?Resp 14 05/04/21 1623  ?SpO2 100 % 05/04/21 1623  ?Vitals shown include unvalidated device data. ? ?Last Pain:  ?Vitals:  ? 05/04/21 1430  ?TempSrc: Oral  ?PainSc:   ?   ? ?  ? ?Complications: No notable events documented. ?

## 2021-05-04 NOTE — Progress Notes (Signed)
Labor Progress Note ?Katalyna Tawnya Pujol is a 21 y.o. G3P0020 at [redacted]w[redacted]d presented for IOL 2/2 to [place] and A2GDM ? ?Assessed at bedside as patient having late and variable decels again s/p decreasing pitocin and an amnio-infusion ? ?S: Patient doing well, very comfortable with epidural  ? ?O:  ?BP 124/69   Pulse 92   Temp 97.9 ?F (36.6 ?C) (Oral)   Resp 20   Ht 5\' 10"  (1.778 m)   Wt 125.4 kg   LMP 09/21/2020 Comment: Pt report heavy bleeding and clotsz  SpO2 99%   BMI 39.67 kg/m?  ?EFM: baseline 150/moderate variability/+ accels, no decels (since position change)  ? ?CVE: Dilation: 5.5 ?Effacement (%): 60 ?Cervical Position: Middle ?Station: -3 ?Presentation: Vertex ?Exam by:: [email or web address], RN ? ? ?A&P: 21 y.o. G3P0020 110w1d  ? ?#Labor: Progressing well. Making appropriate change, contracting strongly on toco.  FSE placed, position change ?#Pain: Epidural ?#FWB: Cat 1 ?#GBS negative ? ? ?[redacted]w[redacted]d, DO, PGY-1 ?1:40 AM  ?

## 2021-05-04 NOTE — Progress Notes (Signed)
Dr. Ephriam Jenkins reviewed plan of care with me.  ? ?She rechecked the patient at about 45 minutes ago and felt the patient was unchanged.  ?She also noted the patient and FHT starting to increase concerning for chorio. Given periods of minimal variability and this information, Dr. Ephriam Jenkins started the patient on antibiotics for chorio to see if FHT would improved. The first dose was given at 6:36am of Ampicillin. Once antibiotics given and a short time has elapsed, 30-60 minutes, we will re-evaluate regarding need for c-section. She has already counseled the patient on this possibility and patient understands.  ? ?I personally reviewed the tracing -  ?FHT 150, minimal and moderate variability, occasional accels, occasional decels - no recurrent decels) ?Toco Q1-5 min (some coupling) ? ? ?Alison Hock, MD ?Attending Obstetrician & Gynecologist, Faculty Practice ?Center for [employer], [facility] ? ? ?

## 2021-05-05 ENCOUNTER — Encounter: Payer: Medicaid Other | Admitting: Obstetrics and Gynecology

## 2021-05-05 ENCOUNTER — Other Ambulatory Visit: Payer: Medicaid Other

## 2021-05-05 LAB — GLUCOSE, CAPILLARY: Glucose-Capillary: 113 mg/dL — ABNORMAL HIGH (ref 70–99)

## 2021-05-05 LAB — CBC
HCT: 32 % — ABNORMAL LOW (ref 36.0–46.0)
Hemoglobin: 10.7 g/dL — ABNORMAL LOW (ref 12.0–15.0)
MCH: 28.7 pg (ref 26.0–34.0)
MCHC: 33.4 g/dL (ref 30.0–36.0)
MCV: 85.8 fL (ref 80.0–100.0)
Platelets: 232 10*3/uL (ref 150–400)
RBC: 3.73 MIL/uL — ABNORMAL LOW (ref 3.87–5.11)
RDW: 13.8 % (ref 11.5–15.5)
WBC: 16.8 10*3/uL — ABNORMAL HIGH (ref 4.0–10.5)
nRBC: 0 % (ref 0.0–0.2)

## 2021-05-05 MED ORDER — FERROUS SULFATE 325 (65 FE) MG PO TABS
325.0000 mg | ORAL_TABLET | ORAL | Status: DC
Start: 2021-05-05 — End: 2021-05-07
  Administered 2021-05-05 – 2021-05-07 (×2): 325 mg via ORAL
  Filled 2021-05-05 (×2): qty 1

## 2021-05-05 NOTE — Progress Notes (Addendum)
Patient ID: Alison Morales, female   DOB: Feb 14, 2000, 21 y.o.   MRN: [id number] ?POSTPARTUM PROGRESS NOTE ? ?Post Partum Day 1 ? ?Subjective: ? ?Alison Morales is a 21 y.o. [phone] s/p primary cesarean section at [redacted]w[redacted]d.  No acute events overnight.  Patient denies problems with ambulating, voiding or PO intake.  She denies nausea or vomiting.  Pain is well controlled.  She has had flatus. She has not had bowel movement.  Lochia Minimal.  She is breastfeeding well.  She has no other concerns at this time.  ? ?Objective: ?Blood pressure 116/61, pulse 93, temperature 98.5 ?F (36.9 ?C), temperature source Oral, resp. rate 18, height 5\' 10"  (1.778 m), weight 125.4 kg, last menstrual period 09/21/2020, SpO2 100 %, unknown if currently breastfeeding. ? ?Physical Exam:  ?General: alert, cooperative and no distress ?Chest: no respiratory distress ?Heart: normal rate, warm and well perfused  ?Abdomen: soft, nontender ?Uterine Fundus: firm and below umbilicus  ?Extremities: minimal edema, no calf tenderness to palpation  ?Skin: honeycomb dressing in place, incision clean/dry/intact  ? ?Recent Labs  ?  05/03/21 ?2035 05/05/21 ?0454  ?HGB 13.4 10.7*  ?HCT 40.3 32.0*  ? ? ?Assessment/Plan: ?Alison Morales is a 21 y.o. [phone] s/p PLTCS at [redacted]w[redacted]d  ? ?#Acute blood loss anemia: ?- Hgb 13.4>10.7 post-operatively ?- Asymptomatic  ?- Will start PO iron ? ?#A2GDM: ?- AM CBG 113, not fasting (juice shortly before checking) ?- CBGs within normal limits in labor ?- Plan for GTT at postpartum visit  ? ?#Chronic HTN: ?- BP within normal limits, no symptoms of pre-E ?- Will start Lasix 20 mg daily today to complete a 5 day course ?- Will have BP check in one week  ? ?#Triple I: ?- Afebrile since delivery  ?- Started on Cefotetan post-op for 24 hour coverage ?- Will stop around 3:30 PM today ?- Will continue to monitor ? ?Contraception: IUD at postpartum visit ?Feeding: Breast ?Dispo: Plan for discharge on POD#2. ? ? LOS: 2 days   ? ?Fontaine No, Success ?05/05/2021, 7:11 AM   ? ?GME ATTESTATION:  ?I saw and evaluated the patient. I agree with the findings and the plan of care as documented in the student?s note. I have personally performed the physical exam above and coordinated management for this patient. I have made changes to documentation as necessary. ? ?Vilma Meckel, MD ?OB Fellow, Faculty Practice ?[place] for [facility] ?05/05/2021 10:40 AM ? ?

## 2021-05-06 NOTE — Lactation Note (Signed)
This note was copied from a baby's chart. ?Lactation Consultation Note ? ?Patient Name: Alison Morales ?Today's Date: 05/06/2021 ?Reason for consult: Initial assessment;1st time breastfeeding;Primapara;Follow-up assessment;Breastfeeding assistance ?Age:23 hours ? ?Mom states that things are going well with breastfeeding. Baby latches well. Mom says that she has some soreness due to cluster feeding, but not during the feed.  ? ?Baby latched well without assistance and LC helped with setting up support pillows.  ? ?Baby latched on the right and we attempted to latch on the left, but baby did not latch. Mom placed her on the right and she latched again.  ? ?LC noted that mom's left breast seemed full and Mom stated that she would like to pump the left side for relief and stimulation. LC assisted mom with setting up DEBP and spoke with her about milk storage guidelines and washing pump parts.  ? ?Mom has a size 24 flange.  ? ?The parents asked if LC could get them nipples to feed the baby expressed milk via a bottle. [name] spoke with the nurse and 2 yellow slow flow nipples were given to the parents.  ? ?Mom states that she has no further questions and will call for lactation assistance if needed.  ? ?Mom has been educated on what to do if she becomes engorged and was reminded of outpatient [place] services and support groups. ? ?Plan:  ?Mom will continue to watch baby's feeding cues and latch according to cues. Offer both sides and if baby does not latch on the left, pump the left breast after/during each feeding or every 3 hours. ? ? ?LATCH Score ?Latch: Grasps breast easily, tongue down, lips flanged, rhythmical sucking. ? ?Audible Swallowing: A few with stimulation ? ?Type of Nipple: Everted at rest and after stimulation ? ?Comfort (Breast/Nipple): Soft / non-tender ? ?Hold (Positioning): No assistance needed to correctly position infant at breast. ? ?LATCH Score: 9 ? ? ?Lactation Tools Discussed/Used ?Tools:  Pump ?Breast pump type: Double-Electric Breast Pump ?Pump Education: Setup, frequency, and cleaning;Milk Storage ?Reason for Pumping: Baby only latches to the right side and the left needs stimulation. ? ?Interventions ?Interventions: Breast feeding basics reviewed;Support pillows;DEBP;Education ? ?Discharge ?Pump: DEBP;Personal ? ?Consult Status ?Consult Status: Follow-up ?Date: 05/07/21 ?Follow-up type: In-patient ? ? ? ?[place], IBCLC ?05/06/2021, 12:15 PM ? ? ? ?

## 2021-05-06 NOTE — Progress Notes (Signed)
POSTPARTUM PROGRESS NOTE ? ?Post Operation Day 2 ? ?Subjective: ? ?Alison Morales is a 21 y.o. [phone] s/p pLTCS at [redacted]w[redacted]d for fetal intolerance of labor and arrest of dilation after IOL for chronic HTN and A2 Gestational DM.  No acute events overnight, patient says she is experiencing abdominal pain that was 5/10 when she was sitting up in chair this morning, but pain has now improved.  Pt denies problems with ambulating, voiding or po intake.  She denies nausea or vomiting.  Pain is well controlled.  She has had flatus. She has not had bowel movement.  Lochia Minimal.  ? ?Objective: ?Blood pressure 134/82, pulse 99, temperature 98 ?F (36.7 ?C), temperature source Oral, resp. rate 18, height 5\' 10"  (1.778 m), weight 125.4 kg, last menstrual period 09/21/2020, SpO2 99 %, unknown if currently breastfeeding. ? ?Physical Exam:  ?General: alert, cooperative, and no distress ?Chest: no respiratory distress, CTAB ?Heart:normal rate, regular rhythm, no murmurs ?Abdomen: soft, mild tenderness on palpation ?Uterine Fundus: firm, appropriately tender ?DVT Evaluation: No calf swelling or tenderness ?Extremities: mild LE edema ?Skin: warm, dry; incision clean/dry/intact ? ?Recent Labs  ?  05/03/21 ?2035 05/05/21 ?0454  ?HGB 13.4 10.7*  ?HCT 40.3 32.0*  ? ? ?Assessment/Plan: ?Alison Morales is a 21 y.o. [phone] s/p pLTCS at [redacted]w[redacted]d for fetal intolerance of labor and arrest of dilation after IOL for chronic HTN and A2 Gestational DM. ? ?POD#2 - Doing well, occasional moderate abdominal pain controlled by meds ?Contraception: IUD at postpartum visit ?Feeding: Breast ?Dispo: Plan for discharge POD3. ? ?#Anemia: Continue PO iron ? ?#A2GDM: CBG within normal limits yesterday. Plan for GTT postpartum visit. ? ?#Chronic HTN: BP 134/82 this morning. Continue Lasix 20 mg 5 day course. Check BP in one week. ? ?#TripleI: Completed Cefotetan course yesterday. Afebrile since delivery. Will continue to monitor. ? ? LOS: 3 days   ? ?Alison Morales, Medical Student,  ?05/06/2021, 7:40 AM   ?

## 2021-05-06 NOTE — Progress Notes (Signed)
Mother asked for formula to supplement baby's feedings. Educated Mother on formula feedings to supplement breast feeding. Formula/BF supplement sheet at beside. MOB and FOB verbalized understanding. Comfort gels and Ice given to mother for extremely sore nipples and breasts.  ?

## 2021-05-06 NOTE — Lactation Note (Addendum)
This note was copied from a baby's chart. ?Lactation Consultation Note ? ?Patient Name: Alison Morales ?Today's Date: 05/06/2021 ?Reason for consult: Follow-up assessment;Mother's request;Difficult latch;1st time breastfeeding (-5% weight loss, C/S and CHTN) ?Age:23 hours ?P1, term female infant. ?Per mom, her right breast is bleeding infant has not been latching well and infant doesn't latch to her left breast. ?LC entered room, infant had shallow latch and was on the tip of mom's nipple, mom open to re-latching infant. ?LC suggested mom undress infant and remove blanket to BF infant skin to skin. ?Mom latched infant on her left breast using the football hold position, infant latched with depth and swallows observed, LC mention breast stimulation techniques infant BF for 15 minutes. ?Mom understands if she feels pinching or pain with latch, break latch and re-latch infant at the breast and to ask RN/LC for further latch assistance if needed.  ?RN will give mom coconut oil and mom knows she can use her own EBM and let air dry to help with healing. ?LC gave mom comfort gels and mom understands not use with coconut oil. ?Mom knows how to hand express. ?Mom hand expressed or right breast due not latching infant on this side due abrasion and bleeding earlier mom expressed 8 mls of colostrum that was bottle feed to infant with green slow nipple.  ?Per mom, she is using DEBP and pump 2 mls , she plans to continue to pump every 3 hours for 15 minutes on initial setting and give infant back any EBM. ?Mom's plan: ?1- Continue to BF infant on demand, hunger cues, 8 to 12+ or more times within 24 hours, skin to skin. ?2-Try latch infant on both breast during a feeding. ?3- Ask for latch assistance if needed. ?4- Continue to use DEBP and give infant back any EBM after latching infant at the breast. ?Maternal Data ?  ? ?Feeding ?Mother's Current Feeding Choice: Breast Milk ? ?LATCH Score ?Latch: Grasps breast easily, tongue  down, lips flanged, rhythmical sucking. ? ?Audible Swallowing: Spontaneous and intermittent ? ?Type of Nipple: Everted at rest and after stimulation ? ?Comfort (Breast/Nipple): Soft / non-tender ? ?Hold (Positioning): Assistance needed to correctly position infant at breast and maintain latch. ? ?LATCH Score: 9 ? ? ?Lactation Tools Discussed/Used ?  ? ?Interventions ?Interventions: Assisted with latch;Skin to skin;Breast compression;Adjust position;Support pillows;Position options;Expressed milk;Coconut oil;Education ? ?Discharge ?  ? ?Consult Status ?Consult Status: Follow-up ?Date: 05/07/21 ?Follow-up type: In-patient ? ? ? ?Danelle Earthly ?05/06/2021, 3:51 PM ? ? ? ?

## 2021-05-07 ENCOUNTER — Other Ambulatory Visit (HOSPITAL_COMMUNITY): Payer: Self-pay

## 2021-05-07 DIAGNOSIS — O411230 Chorioamnionitis, third trimester, not applicable or unspecified: Secondary | ICD-10-CM

## 2021-05-07 DIAGNOSIS — O1092 Unspecified pre-existing hypertension complicating childbirth: Secondary | ICD-10-CM

## 2021-05-07 DIAGNOSIS — Z3A39 39 weeks gestation of pregnancy: Secondary | ICD-10-CM

## 2021-05-07 DIAGNOSIS — O24425 Gestational diabetes mellitus in childbirth, controlled by oral hypoglycemic drugs: Secondary | ICD-10-CM

## 2021-05-07 MED ORDER — IBUPROFEN 600 MG PO TABS
600.0000 mg | ORAL_TABLET | Freq: Four times a day (QID) | ORAL | 0 refills | Status: AC
  Filled 2021-05-07: qty 30, 8d supply, fill #0

## 2021-05-07 MED ORDER — FUROSEMIDE 20 MG PO TABS
20.0000 mg | ORAL_TABLET | Freq: Every day | ORAL | 0 refills | Status: DC
  Filled 2021-05-07: qty 3, 3d supply, fill #0

## 2021-05-07 MED ORDER — OXYCODONE HCL 5 MG PO TABS
5.0000 mg | ORAL_TABLET | Freq: Four times a day (QID) | ORAL | 0 refills | Status: AC | PRN
  Filled 2021-05-07: qty 25, 3d supply, fill #0

## 2021-05-07 NOTE — Lactation Note (Signed)
This note was copied from a baby's chart. ?Lactation Consultation Note ? ?Patient Name: Girl Sydne Krahl ?Today's Date: 05/07/2021 ?Reason for consult: Follow-up assessment;Primapara;Term;Exclusive pumping and bottle feeding;Maternal endocrine disorder ?Age:23 hours ? ? ?P1 mother whose infant is now 60 hours old.  This is a term baby at 39+1 weeks.  Mother's current feeding preference is to pump and bottle feed her expressed milk. ? ?Baby "Jayda" was asleep in mother's arms when I arrived.  Mother recently finished pumping and was able to obtain 35 mls of expressed milk which she fed to "[place]."  Praised mother for her continued efforts with pumping. ? ?Reviewed feeding plan for after discharge.  Encouraged to continue feeding on cues or at least 8-12 times/24 hours.  Baby has had multiple voids/stools.  Her stools are transitioning.  Parents had no further questions/concerns.  They have a return pediatric appointment for Monday.  RN updated. ? ? ?Maternal Data ?Has patient been taught Hand Expression?: Yes ?Does the patient have breastfeeding experience prior to this delivery?: No ? ?Feeding ?Mother's Current Feeding Choice: Breast Milk ? ?LATCH Score ?  ? ?  ? ?  ? ?  ? ?  ? ?  ? ? ?Lactation Tools Discussed/Used ?Tools: Flanges;Pump;Coconut oil;Comfort gels ?Flange Size: 24 ?Breast pump type: Double-Electric Breast Pump;Manual ?Reason for Pumping: Mother's feeding preference is to pump and bottle feed ?Pumping frequency: Every three hours ?Pumped volume: 35 mL ? ?Interventions ?Interventions: Education;Coconut oil;Hand pump;DEBP ? ?Discharge ?Discharge Education: Engorgement and breast care (Taught yesterday) ?Pump: Hands Free;Personal ?WIC Program: No ? ?Consult Status ?Consult Status: Complete ?Date: 05/07/21 ?Follow-up type: Call as needed ? ? ? ?Selena Swaminathan R Pollyann Roa ?05/07/2021, 8:45 AM ? ? ? ?

## 2021-05-12 ENCOUNTER — Ambulatory Visit: Payer: Medicaid Other

## 2021-05-12 ENCOUNTER — Telehealth (HOSPITAL_COMMUNITY): Payer: Self-pay | Admitting: *Deleted

## 2021-05-12 NOTE — Telephone Encounter (Signed)
Left phone voicemail message. ? ?Duffy Rhody, RN 05-12-2021 at 3:00pm ?

## 2021-05-31 ENCOUNTER — Other Ambulatory Visit: Payer: Self-pay

## 2021-05-31 DIAGNOSIS — O24429 Gestational diabetes mellitus in childbirth, unspecified control: Secondary | ICD-10-CM

## 2021-06-02 ENCOUNTER — Other Ambulatory Visit (INDEPENDENT_AMBULATORY_CARE_PROVIDER_SITE_OTHER): Payer: Medicaid Other

## 2021-06-02 ENCOUNTER — Encounter: Payer: Self-pay | Admitting: Family Medicine

## 2021-06-02 ENCOUNTER — Ambulatory Visit (INDEPENDENT_AMBULATORY_CARE_PROVIDER_SITE_OTHER): Payer: Medicaid Other | Admitting: Family Medicine

## 2021-06-02 DIAGNOSIS — Z98891 History of uterine scar from previous surgery: Secondary | ICD-10-CM

## 2021-06-02 DIAGNOSIS — Z8632 Personal history of gestational diabetes: Secondary | ICD-10-CM | POA: Diagnosis not present

## 2021-06-02 DIAGNOSIS — O24429 Gestational diabetes mellitus in childbirth, unspecified control: Secondary | ICD-10-CM

## 2021-06-02 DIAGNOSIS — O10919 Unspecified pre-existing hypertension complicating pregnancy, unspecified trimester: Secondary | ICD-10-CM

## 2021-06-02 LAB — POCT PREGNANCY, URINE: Preg Test, Ur: NEGATIVE

## 2021-06-02 NOTE — Progress Notes (Signed)
Post Partum Visit Note  Alison Morales is a 21 y.o. [phone] female who presents for a postpartum visit. She is 4 weeks postpartum following a primary cesarean section.  I have fully reviewed the prenatal and intrapartum course. The delivery was at 39 gestational weeks.  Anesthesia: epidural. Postpartum course has been well. Baby is doing well. Baby is feeding by bottle - Similac with Iron. Bleeding no bleeding. Bowel function is normal. Bladder function is normal. Patient is sexually active. Contraception method is IUD. Postpartum depression screening: negative.   The pregnancy intention screening data noted above was reviewed. Potential methods of contraception were discussed. The patient elected to proceed with No data recorded.   Edinburgh Postnatal Depression Scale - 06/02/21 0834       Edinburgh Postnatal Depression Scale:  In the Past 7 Days   I have been able to laugh and see the funny side of things. 0    I have looked forward with enjoyment to things. 0    I have blamed myself unnecessarily when things went wrong. 0    I have been anxious or worried for no good reason. 0    I have felt scared or panicky for no good reason. 0    Things have been getting on top of me. 0    I have been so unhappy that I have had difficulty sleeping. 0    I have felt sad or miserable. 0    I have been so unhappy that I have been crying. 0    The thought of harming myself has occurred to me. 0    Edinburgh Postnatal Depression Scale Total 0             Health Maintenance Due  Topic Date Due   COVID-19 Vaccine (1) Never done   URINE MICROALBUMIN  Never done   HPV VACCINES (1 - 2-dose series) Never done    The following portions of the patient's history were reviewed and updated as appropriate: allergies, current medications, past family history, past medical history, past social history, past surgical history, and problem list.  Review of Systems Pertinent items noted in HPI and  remainder of comprehensive ROS otherwise negative.  Objective:  BP 128/80   Pulse 82   Wt 256 lb 3.2 oz (116.2 kg)   LMP 09/21/2020 Comment: Pt report heavy bleeding and clotsz  BMI 36.76 kg/m    General:  alert, cooperative, and appears stated age   Breasts:  not indicated  Lungs: Comfortable on room air  Abdomen: soft, non-tender; bowel sounds normal; no masses,  no organomegaly   Wound well approximated incision  GU exam:   Not done       Assessment:    There are no diagnoses linked to this encounter.  Normal postpartum exam.   Plan:   Essential components of care per ACOG recommendations:  1.  Mood and well being: Patient with negative depression screening today. Reviewed local resources for support.  - Patient tobacco use? No.   - hx of drug use? No.    2. Infant care and feeding:  -Patient currently breastmilk feeding? No.  -Social determinants of health (SDOH) reviewed in EPIC. No concerns  3. Sexuality, contraception and birth spacing - Patient does not want a pregnancy in the next year.  Desired family size is unsure number of children.  - Reviewed reproductive life planning. Reviewed contraceptive methods based on pt preferences and effectiveness.  Patient desired IUD or IUS  today.   - Discussed birth spacing of 18 months - Last unprotected intercourse maybe 1-2 weeks ago, will reschedule for 2-3 weeks out for IUD insertion. Emphasized repeatedly no unprotected sex until next visit  4. Sleep and fatigue -Encouraged family/partner/community support of 4 hrs of uninterrupted sleep to help with mood and fatigue  5. Physical Recovery  - Discussed patients delivery and complications. She describes her labor as mixed. - Patient had a C-section failure to progress.  - Patient has urinary incontinence? No. - Patient is safe to resume physical and sexual activity  6.  Health Maintenance - HM due items addressed Yes - Last pap smear  Diagnosis  Date Value Ref  Range Status  12/18/2020   Final   - Negative for intraepithelial lesion or malignancy (NILM)   Pap smear not done at today's visit.  -Breast Cancer screening indicated? No.   7. Chronic Disease/Pregnancy Condition follow up: Hypertension and Gestational Diabetes - cHTN: not on meds, normotensive today - GDM: 2hr GTT today - PCP follow up  Venora Maples, MD Center for [facility], [facility]

## 2021-06-03 LAB — GLUCOSE TOLERANCE, 2 HOURS
Glucose, 2 hour: 90 mg/dL (ref 70–139)
Glucose, GTT - Fasting: 77 mg/dL (ref 70–99)

## 2021-06-15 ENCOUNTER — Other Ambulatory Visit: Payer: Medicaid Other

## 2021-06-15 ENCOUNTER — Ambulatory Visit: Payer: Medicaid Other | Admitting: Family Medicine

## 2021-06-16 ENCOUNTER — Ambulatory Visit: Payer: Medicaid Other | Admitting: Family Medicine

## 2021-07-06 ENCOUNTER — Ambulatory Visit: Payer: Medicaid Other | Admitting: Family Medicine

## 2021-08-25 ENCOUNTER — Ambulatory Visit: Payer: Medicaid Other | Admitting: Family Medicine

## 2021-10-27 ENCOUNTER — Encounter: Payer: Self-pay | Admitting: Obstetrics and Gynecology

## 2021-10-27 ENCOUNTER — Ambulatory Visit: Payer: Self-pay | Admitting: *Deleted

## 2021-10-27 NOTE — Telephone Encounter (Signed)
  Chief Complaint: Vaginal Spotting Symptoms: Occasional spotting past 2 months, now every day. Mostly in afternoons Frequency: 2 months Pertinent Negatives: Patient denies pain,pregnancy,fever, dizziness Disposition: [] ED /[] Urgent Care (no appt availability in office) / [] Appointment(In office/virtual)/ []  [place] [facility]/ [] Home Care/ [] Refused Recommended Disposition /[] [place] [facility]/ [x]  Follow-up with PCP Additional Notes: Pt calling from [place], relocated, no PCP. Pt left MyChart message for [facility]. Advised to see what he recommends, go to UC for worsening symptoms. Advised to secure PCP ASAP. Reason for Disposition  Periods with > 6 soaked pads or tampons per day  Answer Assessment - Initial Assessment Questions 1. AMOUNT: "Describe the bleeding that you are having."    - SPOTTING: spotting, or pinkish / brownish mucous discharge; does not fill panty liner or pad    - MILD:  less than 1 pad / hour; less than patient's usual menstrual bleeding   - MODERATE: 1-2 pads / hour; 1 menstrual cup every 6 hours; small-medium blood clots (e.g., pea, grape, small coin)   - SEVERE: soaking 2 or more pads/hour for 2 or more hours; 1 menstrual cup every 2 hours; bleeding not contained by pads or continuous red blood from vagina; large blood clots (e.g., golf ball, large coin)      Spotting 2. ONSET: "When did the bleeding begin?" "Is it continuing now?"     Spotting last 2 weeks 3. MENSTRUAL PERIOD: "When was the last normal menstrual period?" "How is this different than your period?"     2 months ago 4. REGULARITY: "How regular are your periods?"     Irregular 5. ABDOMEN PAIN: "Do you have any pain?" "How bad is the pain?"  (e.g., Scale 1-10; mild, moderate, or severe)   - MILD (1-3): doesn't interfere with normal activities, abdomen soft and not tender to touch    - MODERATE (4-7): interferes with normal activities or awakens from sleep, abdomen tender to touch    -  SEVERE (8-10): excruciating pain, doubled over, unable to do any normal activities      No 6. PREGNANCY: "Is there any chance you are pregnant?" "When was your last menstrual period?"     No 7. BREASTFEEDING: "Are you breastfeeding?"     No 8. HORMONE MEDICINES: "Are you taking any hormone medicines, prescription or over-the-counter?" (e.g., birth control pills, estrogen)     no 9. BLOOD THINNER MEDICINES: "Do you take any blood thinners?" (e.g., Coumadin / warfarin, Pradaxa / dabigatran, aspirin)     no 10. CAUSE: "What do you think is causing the bleeding?" (e.g., recent gyn surgery, recent gyn procedure; known bleeding disorder, cervical cancer, polycystic ovarian disease, fibroids)         Unsure 11. HEMODYNAMIC STATUS: "Are you weak or feeling lightheaded?" If Yes, ask: "Can you stand and walk normally?"        No 12. OTHER SYMPTOMS: "What other symptoms are you having with the bleeding?" (e.g., passed tissue, vaginal discharge, fever, menstrual-type cramps)       No  Protocols used: Vaginal Bleeding - Abnormal-A-AH

## 2023-06-03 IMAGING — US US FETAL BPP W/ NON-STRESS
1 series · 13 of 15 positions shown · non-contrast
Comparison: none

[Series 1: us fetal bpp w/ non-stress · 15 acquisitions, 13 frames shown]
[im 1/15]
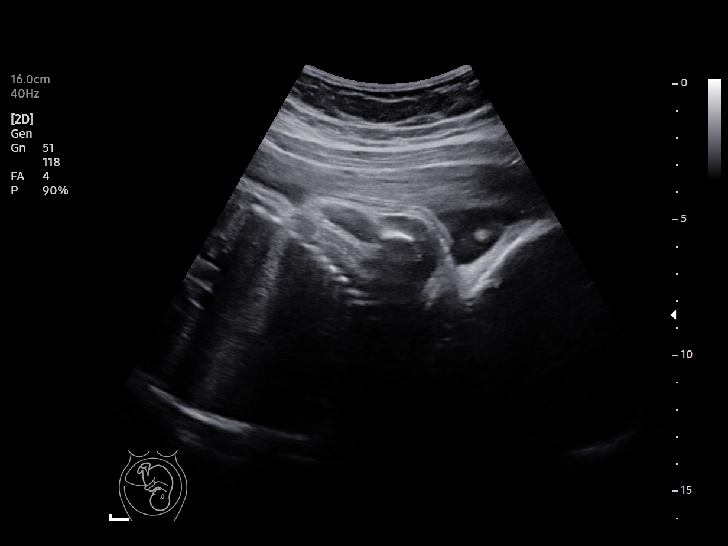
[im 2/15]
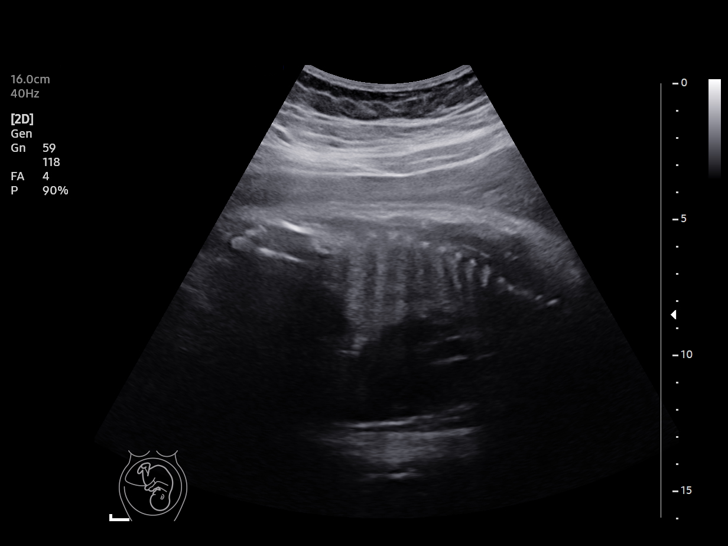
[im 3/15]
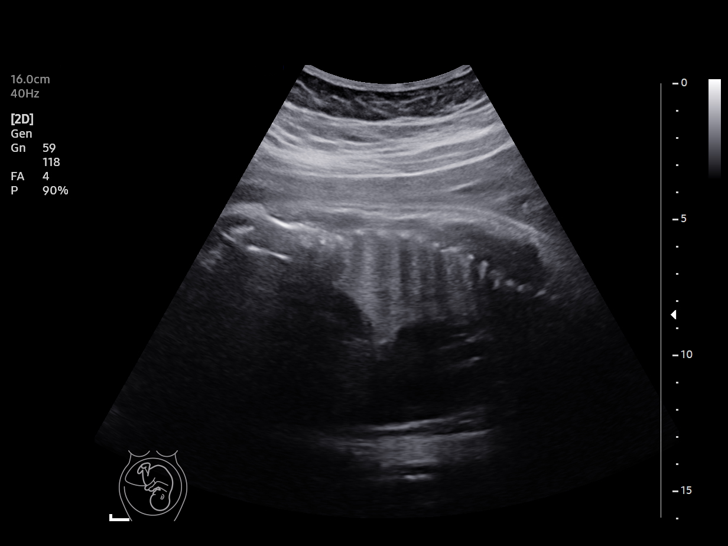
[im 5/15]
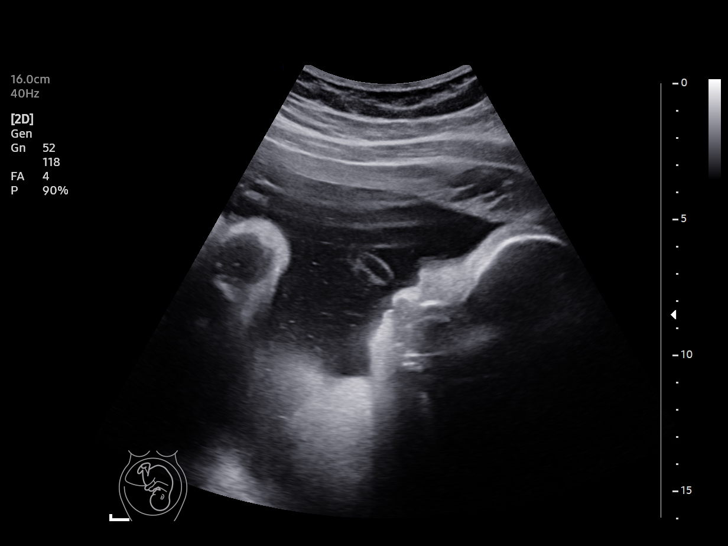
[im 6/15]
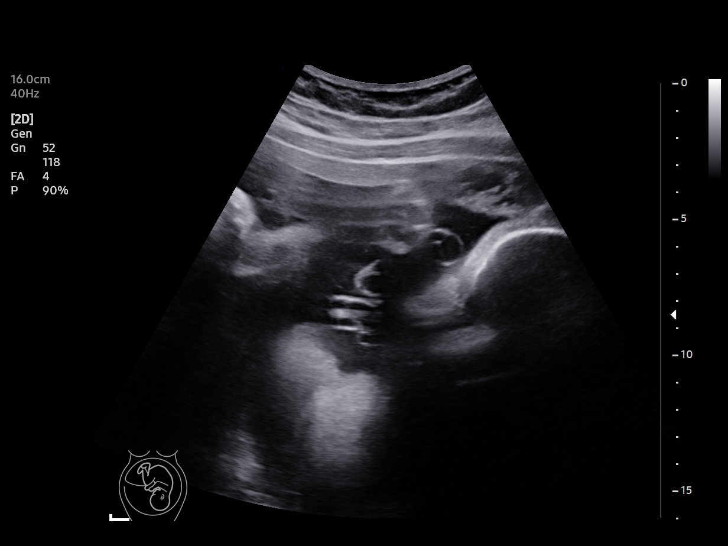
[im 7/15]
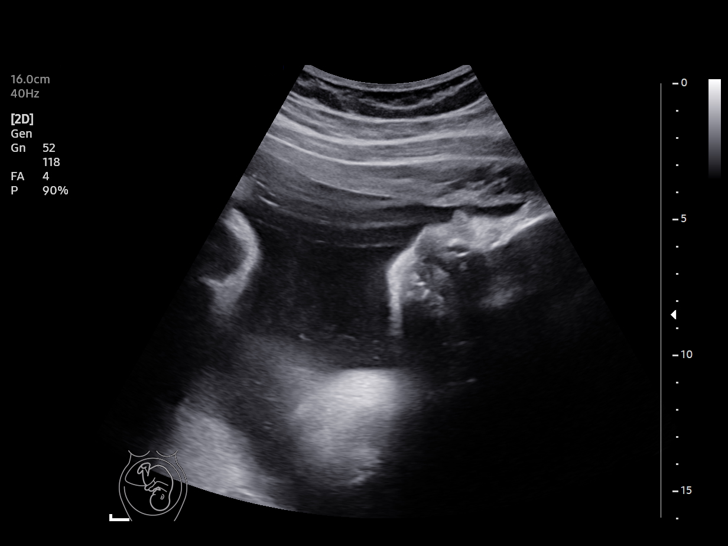
[im 8/15]
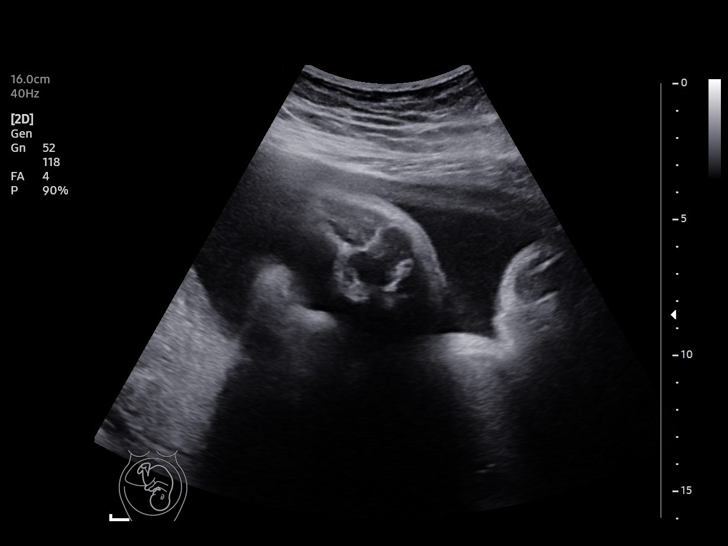
[im 9/15]
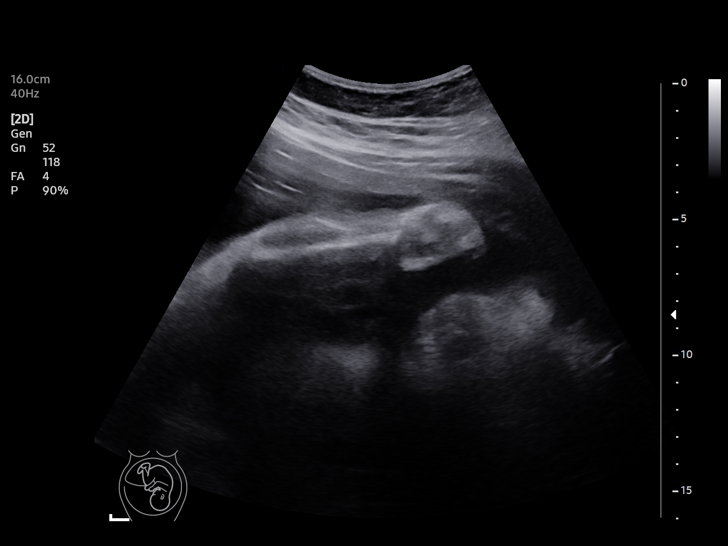
[im 10/15]
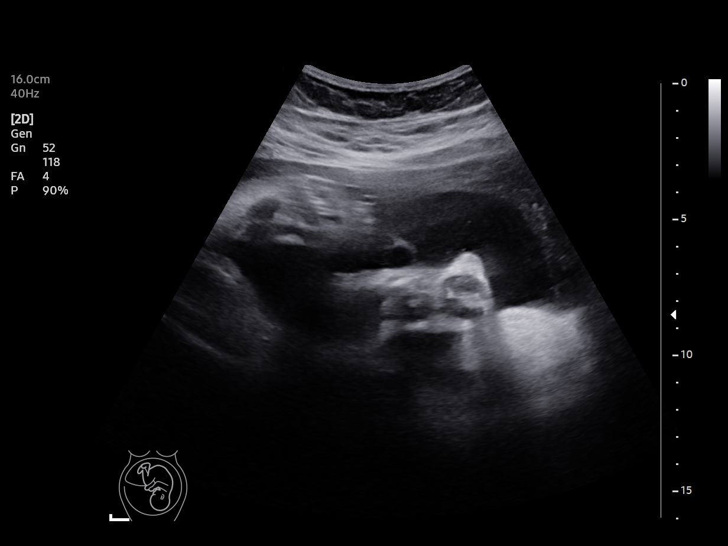
[im 11/15]
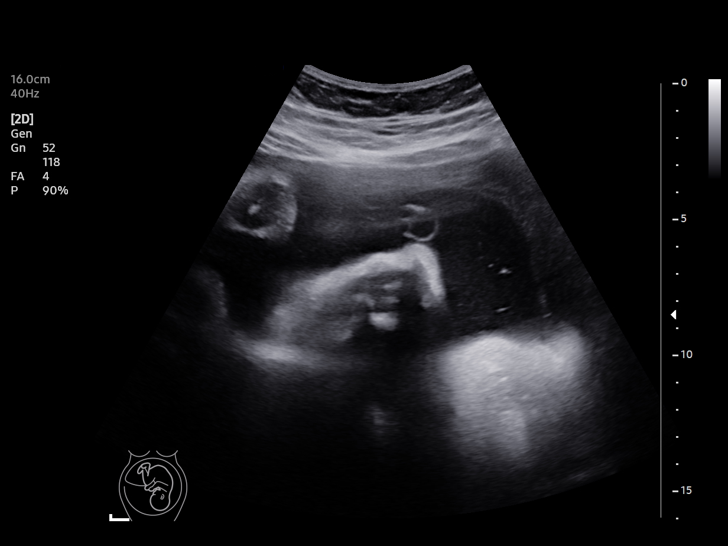
[im 13/15]
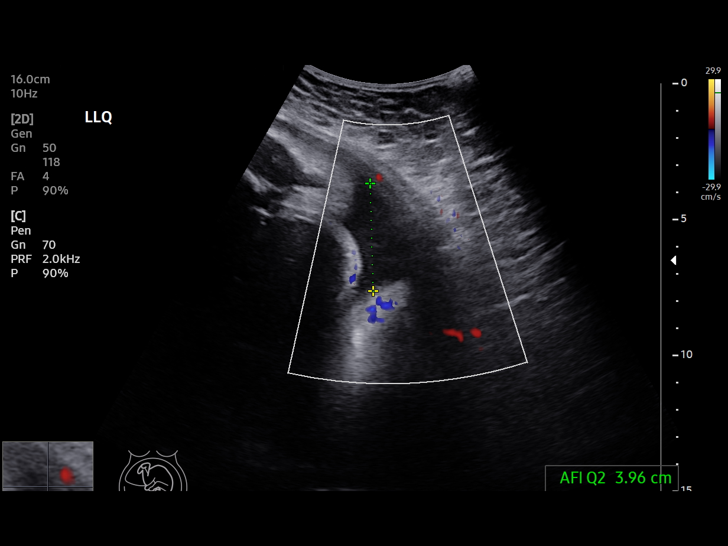
[im 14/15]
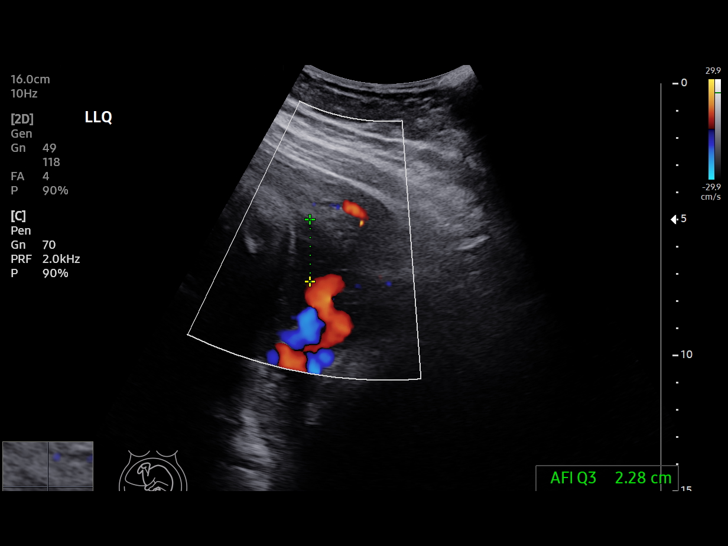
[im 15/15]
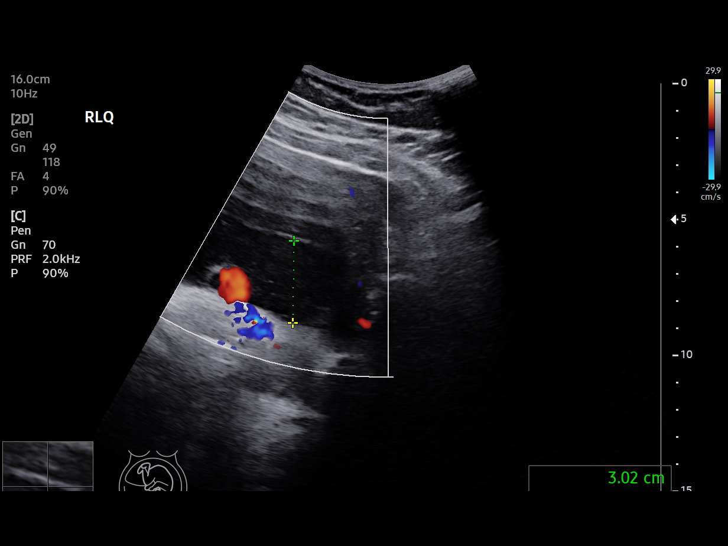

[13 of 15 positions shown; findings below may reference images not displayed]

[REDACTED]care at

Service(s) Provided

Indications

 35 weeks gestation of pregnancy
 Gestational diabetes in pregnancy,
 controlled by oral hypoglycemic drugs
 Obesity complicating pregnancy, third
 trimester
Fetal Evaluation

 Num Of Fetuses:         1
 Preg. Location:         Intrauterine
 Cardiac Activity:       Observed
 Presentation:           Cephalic

 Amniotic Fluid
 AFI FV:      Within normal limits

 AFI Sum(cm)     %Tile       Largest Pocket(cm)
 14.09           51

 RUQ(cm)       RLQ(cm)       LUQ(cm)        LLQ(cm)

Biophysical Evaluation
 Amniotic F.V:   Pocket => 2 cm             F. Tone:        Observed
 F. Movement:    Observed                   N.S.T:          Reactive
 F. Breathing:   Observed                   Score:          [DATE]
OB History

 Gravidity:    3         Term:   0        Prem:   0        SAB:   1
 TOP:          0       Ectopic:  1        Living: 0
Gestational Age

 Best:          35w 2d     Det. By:  Early Ultrasound         EDD:   05/10/21
                                     (10/21/20)
Impression

 Antenatal testing due to GDM on metformin.
 Testing is reassuring, BPP [DATE].
Recommendations

 Continue weekly antenatal testing till delivery .
              [place], [place]

## 2023-08-24 ENCOUNTER — Encounter: Payer: Self-pay | Admitting: Obstetrics and Gynecology
